# Patient Record
Sex: Male | Born: 1935
Health system: Southern US, Community
[De-identification: ages and names within clinical notes are randomized; demographics above are authoritative.]

## PROBLEM LIST (undated history)

## (undated) DIAGNOSIS — J9601 Acute respiratory failure with hypoxia: Secondary | ICD-10-CM

## (undated) DIAGNOSIS — R432 Parageusia: Secondary | ICD-10-CM

## (undated) DIAGNOSIS — R4181 Age-related cognitive decline: Secondary | ICD-10-CM

## (undated) DIAGNOSIS — D320 Benign neoplasm of cerebral meninges: Secondary | ICD-10-CM

## (undated) DIAGNOSIS — R911 Solitary pulmonary nodule: Secondary | ICD-10-CM

## (undated) DIAGNOSIS — R030 Elevated blood-pressure reading, without diagnosis of hypertension: Secondary | ICD-10-CM

## (undated) DIAGNOSIS — I2694 Multiple subsegmental pulmonary emboli without acute cor pulmonale: Secondary | ICD-10-CM

## (undated) HISTORY — DX: Solitary pulmonary nodule: R91.1

## (undated) HISTORY — DX: Age-related cognitive decline: R41.81

## (undated) HISTORY — DX: Acute respiratory failure with hypoxia: J96.01

## (undated) HISTORY — DX: Benign neoplasm of cerebral meninges: D32.0

## (undated) HISTORY — DX: Elevated blood-pressure reading, without diagnosis of hypertension: R03.0

## (undated) HISTORY — DX: Parageusia: R43.2

## (undated) HISTORY — PX: REPLACEMENT TOTAL KNEE BILATERAL: SUR1225

## (undated) HISTORY — PX: CATARACT EXTRACTION, BILATERAL: SHX1313

## (undated) HISTORY — DX: Multiple subsegmental thrombotic pulmonary emboli without acute cor pulmonale: I26.94

---

## 2015-01-04 DIAGNOSIS — M19011 Primary osteoarthritis, right shoulder: Secondary | ICD-10-CM | POA: Diagnosis not present

## 2015-01-04 DIAGNOSIS — R413 Other amnesia: Secondary | ICD-10-CM | POA: Diagnosis not present

## 2015-01-04 DIAGNOSIS — M25511 Pain in right shoulder: Secondary | ICD-10-CM | POA: Diagnosis not present

## 2015-01-10 DIAGNOSIS — R222 Localized swelling, mass and lump, trunk: Secondary | ICD-10-CM | POA: Diagnosis not present

## 2015-01-10 DIAGNOSIS — R918 Other nonspecific abnormal finding of lung field: Secondary | ICD-10-CM | POA: Diagnosis not present

## 2015-01-25 DIAGNOSIS — I10 Essential (primary) hypertension: Secondary | ICD-10-CM | POA: Diagnosis not present

## 2015-01-25 DIAGNOSIS — Z0000 Encounter for general adult medical examination without abnormal findings: Secondary | ICD-10-CM | POA: Diagnosis not present

## 2015-01-25 DIAGNOSIS — Z1211 Encounter for screening for malignant neoplasm of colon: Secondary | ICD-10-CM | POA: Diagnosis not present

## 2015-02-01 DIAGNOSIS — Z1211 Encounter for screening for malignant neoplasm of colon: Secondary | ICD-10-CM | POA: Diagnosis not present

## 2015-03-02 DIAGNOSIS — R911 Solitary pulmonary nodule: Secondary | ICD-10-CM | POA: Diagnosis not present

## 2015-03-02 DIAGNOSIS — M25511 Pain in right shoulder: Secondary | ICD-10-CM | POA: Diagnosis not present

## 2015-03-08 DIAGNOSIS — R918 Other nonspecific abnormal finding of lung field: Secondary | ICD-10-CM | POA: Diagnosis not present

## 2015-03-08 DIAGNOSIS — R911 Solitary pulmonary nodule: Secondary | ICD-10-CM | POA: Diagnosis not present

## 2015-03-09 DIAGNOSIS — M75101 Unspecified rotator cuff tear or rupture of right shoulder, not specified as traumatic: Secondary | ICD-10-CM | POA: Diagnosis not present

## 2015-03-09 DIAGNOSIS — M13811 Other specified arthritis, right shoulder: Secondary | ICD-10-CM | POA: Diagnosis not present

## 2015-03-09 DIAGNOSIS — M25511 Pain in right shoulder: Secondary | ICD-10-CM | POA: Diagnosis not present

## 2015-03-09 DIAGNOSIS — M65811 Other synovitis and tenosynovitis, right shoulder: Secondary | ICD-10-CM | POA: Diagnosis not present

## 2015-03-09 DIAGNOSIS — S4381XA Sprain of other specified parts of right shoulder girdle, initial encounter: Secondary | ICD-10-CM | POA: Diagnosis not present

## 2015-03-16 DIAGNOSIS — M75101 Unspecified rotator cuff tear or rupture of right shoulder, not specified as traumatic: Secondary | ICD-10-CM | POA: Diagnosis not present

## 2015-03-25 DIAGNOSIS — R9431 Abnormal electrocardiogram [ECG] [EKG]: Secondary | ICD-10-CM | POA: Diagnosis not present

## 2015-03-25 DIAGNOSIS — Z01810 Encounter for preprocedural cardiovascular examination: Secondary | ICD-10-CM | POA: Diagnosis not present

## 2015-03-25 DIAGNOSIS — Z01818 Encounter for other preprocedural examination: Secondary | ICD-10-CM | POA: Diagnosis not present

## 2015-03-25 DIAGNOSIS — I517 Cardiomegaly: Secondary | ICD-10-CM | POA: Diagnosis not present

## 2015-03-29 DIAGNOSIS — M7521 Bicipital tendinitis, right shoulder: Secondary | ICD-10-CM | POA: Diagnosis not present

## 2015-03-29 DIAGNOSIS — M75101 Unspecified rotator cuff tear or rupture of right shoulder, not specified as traumatic: Secondary | ICD-10-CM | POA: Diagnosis not present

## 2015-03-29 DIAGNOSIS — G8918 Other acute postprocedural pain: Secondary | ICD-10-CM | POA: Diagnosis not present

## 2015-03-29 DIAGNOSIS — M65811 Other synovitis and tenosynovitis, right shoulder: Secondary | ICD-10-CM | POA: Diagnosis not present

## 2015-03-29 DIAGNOSIS — M75121 Complete rotator cuff tear or rupture of right shoulder, not specified as traumatic: Secondary | ICD-10-CM | POA: Diagnosis not present

## 2015-03-29 DIAGNOSIS — R0683 Snoring: Secondary | ICD-10-CM | POA: Diagnosis not present

## 2015-03-29 DIAGNOSIS — Z87891 Personal history of nicotine dependence: Secondary | ICD-10-CM | POA: Diagnosis not present

## 2015-03-29 DIAGNOSIS — M7551 Bursitis of right shoulder: Secondary | ICD-10-CM | POA: Diagnosis not present

## 2015-04-01 DIAGNOSIS — M25511 Pain in right shoulder: Secondary | ICD-10-CM | POA: Diagnosis not present

## 2015-04-01 DIAGNOSIS — M75101 Unspecified rotator cuff tear or rupture of right shoulder, not specified as traumatic: Secondary | ICD-10-CM | POA: Diagnosis not present

## 2015-04-01 DIAGNOSIS — M25611 Stiffness of right shoulder, not elsewhere classified: Secondary | ICD-10-CM | POA: Diagnosis not present

## 2015-04-04 DIAGNOSIS — M25511 Pain in right shoulder: Secondary | ICD-10-CM | POA: Diagnosis not present

## 2015-04-04 DIAGNOSIS — M25611 Stiffness of right shoulder, not elsewhere classified: Secondary | ICD-10-CM | POA: Diagnosis not present

## 2015-04-04 DIAGNOSIS — M75101 Unspecified rotator cuff tear or rupture of right shoulder, not specified as traumatic: Secondary | ICD-10-CM | POA: Diagnosis not present

## 2015-04-06 DIAGNOSIS — M75101 Unspecified rotator cuff tear or rupture of right shoulder, not specified as traumatic: Secondary | ICD-10-CM | POA: Diagnosis not present

## 2015-04-06 DIAGNOSIS — M25611 Stiffness of right shoulder, not elsewhere classified: Secondary | ICD-10-CM | POA: Diagnosis not present

## 2015-04-06 DIAGNOSIS — M25511 Pain in right shoulder: Secondary | ICD-10-CM | POA: Diagnosis not present

## 2015-04-08 DIAGNOSIS — M25511 Pain in right shoulder: Secondary | ICD-10-CM | POA: Diagnosis not present

## 2015-04-08 DIAGNOSIS — M75101 Unspecified rotator cuff tear or rupture of right shoulder, not specified as traumatic: Secondary | ICD-10-CM | POA: Diagnosis not present

## 2015-04-08 DIAGNOSIS — M25611 Stiffness of right shoulder, not elsewhere classified: Secondary | ICD-10-CM | POA: Diagnosis not present

## 2015-04-13 DIAGNOSIS — M75101 Unspecified rotator cuff tear or rupture of right shoulder, not specified as traumatic: Secondary | ICD-10-CM | POA: Diagnosis not present

## 2015-04-13 DIAGNOSIS — M25611 Stiffness of right shoulder, not elsewhere classified: Secondary | ICD-10-CM | POA: Diagnosis not present

## 2015-04-13 DIAGNOSIS — M25511 Pain in right shoulder: Secondary | ICD-10-CM | POA: Diagnosis not present

## 2015-04-14 DIAGNOSIS — M75101 Unspecified rotator cuff tear or rupture of right shoulder, not specified as traumatic: Secondary | ICD-10-CM | POA: Diagnosis not present

## 2015-04-14 DIAGNOSIS — M25611 Stiffness of right shoulder, not elsewhere classified: Secondary | ICD-10-CM | POA: Diagnosis not present

## 2015-04-14 DIAGNOSIS — M25511 Pain in right shoulder: Secondary | ICD-10-CM | POA: Diagnosis not present

## 2015-04-18 DIAGNOSIS — M75101 Unspecified rotator cuff tear or rupture of right shoulder, not specified as traumatic: Secondary | ICD-10-CM | POA: Diagnosis not present

## 2015-04-18 DIAGNOSIS — M25511 Pain in right shoulder: Secondary | ICD-10-CM | POA: Diagnosis not present

## 2015-04-18 DIAGNOSIS — M25611 Stiffness of right shoulder, not elsewhere classified: Secondary | ICD-10-CM | POA: Diagnosis not present

## 2015-04-20 DIAGNOSIS — M25611 Stiffness of right shoulder, not elsewhere classified: Secondary | ICD-10-CM | POA: Diagnosis not present

## 2015-04-20 DIAGNOSIS — M75101 Unspecified rotator cuff tear or rupture of right shoulder, not specified as traumatic: Secondary | ICD-10-CM | POA: Diagnosis not present

## 2015-04-20 DIAGNOSIS — M25511 Pain in right shoulder: Secondary | ICD-10-CM | POA: Diagnosis not present

## 2015-04-22 DIAGNOSIS — M75101 Unspecified rotator cuff tear or rupture of right shoulder, not specified as traumatic: Secondary | ICD-10-CM | POA: Diagnosis not present

## 2015-04-22 DIAGNOSIS — M25611 Stiffness of right shoulder, not elsewhere classified: Secondary | ICD-10-CM | POA: Diagnosis not present

## 2015-04-22 DIAGNOSIS — M25511 Pain in right shoulder: Secondary | ICD-10-CM | POA: Diagnosis not present

## 2015-04-25 DIAGNOSIS — M25511 Pain in right shoulder: Secondary | ICD-10-CM | POA: Diagnosis not present

## 2015-04-25 DIAGNOSIS — M25611 Stiffness of right shoulder, not elsewhere classified: Secondary | ICD-10-CM | POA: Diagnosis not present

## 2015-04-25 DIAGNOSIS — M75101 Unspecified rotator cuff tear or rupture of right shoulder, not specified as traumatic: Secondary | ICD-10-CM | POA: Diagnosis not present

## 2015-04-27 DIAGNOSIS — M25511 Pain in right shoulder: Secondary | ICD-10-CM | POA: Diagnosis not present

## 2015-04-27 DIAGNOSIS — M75101 Unspecified rotator cuff tear or rupture of right shoulder, not specified as traumatic: Secondary | ICD-10-CM | POA: Diagnosis not present

## 2015-04-27 DIAGNOSIS — M25611 Stiffness of right shoulder, not elsewhere classified: Secondary | ICD-10-CM | POA: Diagnosis not present

## 2015-04-29 DIAGNOSIS — M25511 Pain in right shoulder: Secondary | ICD-10-CM | POA: Diagnosis not present

## 2015-04-29 DIAGNOSIS — M75101 Unspecified rotator cuff tear or rupture of right shoulder, not specified as traumatic: Secondary | ICD-10-CM | POA: Diagnosis not present

## 2015-04-29 DIAGNOSIS — M25611 Stiffness of right shoulder, not elsewhere classified: Secondary | ICD-10-CM | POA: Diagnosis not present

## 2015-05-04 DIAGNOSIS — M25611 Stiffness of right shoulder, not elsewhere classified: Secondary | ICD-10-CM | POA: Diagnosis not present

## 2015-05-04 DIAGNOSIS — M75101 Unspecified rotator cuff tear or rupture of right shoulder, not specified as traumatic: Secondary | ICD-10-CM | POA: Diagnosis not present

## 2015-05-04 DIAGNOSIS — M25511 Pain in right shoulder: Secondary | ICD-10-CM | POA: Diagnosis not present

## 2015-05-06 DIAGNOSIS — M25511 Pain in right shoulder: Secondary | ICD-10-CM | POA: Diagnosis not present

## 2015-05-06 DIAGNOSIS — M25611 Stiffness of right shoulder, not elsewhere classified: Secondary | ICD-10-CM | POA: Diagnosis not present

## 2015-05-06 DIAGNOSIS — M75101 Unspecified rotator cuff tear or rupture of right shoulder, not specified as traumatic: Secondary | ICD-10-CM | POA: Diagnosis not present

## 2015-05-11 DIAGNOSIS — M75101 Unspecified rotator cuff tear or rupture of right shoulder, not specified as traumatic: Secondary | ICD-10-CM | POA: Diagnosis not present

## 2015-05-11 DIAGNOSIS — M25611 Stiffness of right shoulder, not elsewhere classified: Secondary | ICD-10-CM | POA: Diagnosis not present

## 2015-05-11 DIAGNOSIS — M25511 Pain in right shoulder: Secondary | ICD-10-CM | POA: Diagnosis not present

## 2015-05-13 DIAGNOSIS — M25611 Stiffness of right shoulder, not elsewhere classified: Secondary | ICD-10-CM | POA: Diagnosis not present

## 2015-05-13 DIAGNOSIS — M25511 Pain in right shoulder: Secondary | ICD-10-CM | POA: Diagnosis not present

## 2015-05-13 DIAGNOSIS — M75101 Unspecified rotator cuff tear or rupture of right shoulder, not specified as traumatic: Secondary | ICD-10-CM | POA: Diagnosis not present

## 2015-05-16 DIAGNOSIS — M75101 Unspecified rotator cuff tear or rupture of right shoulder, not specified as traumatic: Secondary | ICD-10-CM | POA: Diagnosis not present

## 2015-05-16 DIAGNOSIS — M25511 Pain in right shoulder: Secondary | ICD-10-CM | POA: Diagnosis not present

## 2015-05-16 DIAGNOSIS — M25611 Stiffness of right shoulder, not elsewhere classified: Secondary | ICD-10-CM | POA: Diagnosis not present

## 2015-05-18 DIAGNOSIS — M75101 Unspecified rotator cuff tear or rupture of right shoulder, not specified as traumatic: Secondary | ICD-10-CM | POA: Diagnosis not present

## 2015-05-18 DIAGNOSIS — M25611 Stiffness of right shoulder, not elsewhere classified: Secondary | ICD-10-CM | POA: Diagnosis not present

## 2015-05-18 DIAGNOSIS — M25511 Pain in right shoulder: Secondary | ICD-10-CM | POA: Diagnosis not present

## 2015-05-20 DIAGNOSIS — M25511 Pain in right shoulder: Secondary | ICD-10-CM | POA: Diagnosis not present

## 2015-05-20 DIAGNOSIS — M25611 Stiffness of right shoulder, not elsewhere classified: Secondary | ICD-10-CM | POA: Diagnosis not present

## 2015-05-20 DIAGNOSIS — M75101 Unspecified rotator cuff tear or rupture of right shoulder, not specified as traumatic: Secondary | ICD-10-CM | POA: Diagnosis not present

## 2015-05-23 DIAGNOSIS — M75101 Unspecified rotator cuff tear or rupture of right shoulder, not specified as traumatic: Secondary | ICD-10-CM | POA: Diagnosis not present

## 2015-05-23 DIAGNOSIS — M25511 Pain in right shoulder: Secondary | ICD-10-CM | POA: Diagnosis not present

## 2015-05-23 DIAGNOSIS — M25611 Stiffness of right shoulder, not elsewhere classified: Secondary | ICD-10-CM | POA: Diagnosis not present

## 2015-05-25 DIAGNOSIS — M25611 Stiffness of right shoulder, not elsewhere classified: Secondary | ICD-10-CM | POA: Diagnosis not present

## 2015-05-25 DIAGNOSIS — M25511 Pain in right shoulder: Secondary | ICD-10-CM | POA: Diagnosis not present

## 2015-05-25 DIAGNOSIS — M75101 Unspecified rotator cuff tear or rupture of right shoulder, not specified as traumatic: Secondary | ICD-10-CM | POA: Diagnosis not present

## 2015-05-30 DIAGNOSIS — M25511 Pain in right shoulder: Secondary | ICD-10-CM | POA: Diagnosis not present

## 2015-05-30 DIAGNOSIS — M25611 Stiffness of right shoulder, not elsewhere classified: Secondary | ICD-10-CM | POA: Diagnosis not present

## 2015-05-30 DIAGNOSIS — M75101 Unspecified rotator cuff tear or rupture of right shoulder, not specified as traumatic: Secondary | ICD-10-CM | POA: Diagnosis not present

## 2015-06-01 DIAGNOSIS — M75101 Unspecified rotator cuff tear or rupture of right shoulder, not specified as traumatic: Secondary | ICD-10-CM | POA: Diagnosis not present

## 2015-06-01 DIAGNOSIS — M25611 Stiffness of right shoulder, not elsewhere classified: Secondary | ICD-10-CM | POA: Diagnosis not present

## 2015-06-01 DIAGNOSIS — M25511 Pain in right shoulder: Secondary | ICD-10-CM | POA: Diagnosis not present

## 2015-06-06 DIAGNOSIS — M25611 Stiffness of right shoulder, not elsewhere classified: Secondary | ICD-10-CM | POA: Diagnosis not present

## 2015-06-06 DIAGNOSIS — M25511 Pain in right shoulder: Secondary | ICD-10-CM | POA: Diagnosis not present

## 2015-06-06 DIAGNOSIS — M75101 Unspecified rotator cuff tear or rupture of right shoulder, not specified as traumatic: Secondary | ICD-10-CM | POA: Diagnosis not present

## 2015-06-08 DIAGNOSIS — M25511 Pain in right shoulder: Secondary | ICD-10-CM | POA: Diagnosis not present

## 2015-06-08 DIAGNOSIS — M25611 Stiffness of right shoulder, not elsewhere classified: Secondary | ICD-10-CM | POA: Diagnosis not present

## 2015-06-08 DIAGNOSIS — M75101 Unspecified rotator cuff tear or rupture of right shoulder, not specified as traumatic: Secondary | ICD-10-CM | POA: Diagnosis not present

## 2015-06-13 DIAGNOSIS — M75101 Unspecified rotator cuff tear or rupture of right shoulder, not specified as traumatic: Secondary | ICD-10-CM | POA: Diagnosis not present

## 2015-06-13 DIAGNOSIS — M25511 Pain in right shoulder: Secondary | ICD-10-CM | POA: Diagnosis not present

## 2015-06-13 DIAGNOSIS — M25611 Stiffness of right shoulder, not elsewhere classified: Secondary | ICD-10-CM | POA: Diagnosis not present

## 2015-06-15 DIAGNOSIS — M25511 Pain in right shoulder: Secondary | ICD-10-CM | POA: Diagnosis not present

## 2015-06-15 DIAGNOSIS — M25611 Stiffness of right shoulder, not elsewhere classified: Secondary | ICD-10-CM | POA: Diagnosis not present

## 2015-06-15 DIAGNOSIS — M75101 Unspecified rotator cuff tear or rupture of right shoulder, not specified as traumatic: Secondary | ICD-10-CM | POA: Diagnosis not present

## 2015-06-20 DIAGNOSIS — M75101 Unspecified rotator cuff tear or rupture of right shoulder, not specified as traumatic: Secondary | ICD-10-CM | POA: Diagnosis not present

## 2015-06-20 DIAGNOSIS — M25511 Pain in right shoulder: Secondary | ICD-10-CM | POA: Diagnosis not present

## 2015-06-20 DIAGNOSIS — M25611 Stiffness of right shoulder, not elsewhere classified: Secondary | ICD-10-CM | POA: Diagnosis not present

## 2015-06-22 DIAGNOSIS — M25611 Stiffness of right shoulder, not elsewhere classified: Secondary | ICD-10-CM | POA: Diagnosis not present

## 2015-06-22 DIAGNOSIS — M25511 Pain in right shoulder: Secondary | ICD-10-CM | POA: Diagnosis not present

## 2015-06-22 DIAGNOSIS — M75101 Unspecified rotator cuff tear or rupture of right shoulder, not specified as traumatic: Secondary | ICD-10-CM | POA: Diagnosis not present

## 2015-09-06 DIAGNOSIS — Z23 Encounter for immunization: Secondary | ICD-10-CM | POA: Diagnosis not present

## 2016-07-12 DIAGNOSIS — H52223 Regular astigmatism, bilateral: Secondary | ICD-10-CM | POA: Diagnosis not present

## 2016-07-12 DIAGNOSIS — H5210 Myopia, unspecified eye: Secondary | ICD-10-CM | POA: Diagnosis not present

## 2017-02-19 DIAGNOSIS — H18412 Arcus senilis, left eye: Secondary | ICD-10-CM | POA: Diagnosis not present

## 2017-02-19 DIAGNOSIS — H18411 Arcus senilis, right eye: Secondary | ICD-10-CM | POA: Diagnosis not present

## 2017-02-19 DIAGNOSIS — H02839 Dermatochalasis of unspecified eye, unspecified eyelid: Secondary | ICD-10-CM | POA: Diagnosis not present

## 2017-02-19 DIAGNOSIS — H2511 Age-related nuclear cataract, right eye: Secondary | ICD-10-CM | POA: Diagnosis not present

## 2017-02-19 DIAGNOSIS — H2513 Age-related nuclear cataract, bilateral: Secondary | ICD-10-CM | POA: Diagnosis not present

## 2017-03-11 DIAGNOSIS — H2511 Age-related nuclear cataract, right eye: Secondary | ICD-10-CM | POA: Diagnosis not present

## 2017-03-11 DIAGNOSIS — H2513 Age-related nuclear cataract, bilateral: Secondary | ICD-10-CM | POA: Diagnosis not present

## 2017-03-12 DIAGNOSIS — H2512 Age-related nuclear cataract, left eye: Secondary | ICD-10-CM | POA: Diagnosis not present

## 2017-04-01 DIAGNOSIS — H2512 Age-related nuclear cataract, left eye: Secondary | ICD-10-CM | POA: Diagnosis not present

## 2017-05-06 DIAGNOSIS — H35353 Cystoid macular degeneration, bilateral: Secondary | ICD-10-CM | POA: Diagnosis not present

## 2017-05-16 DIAGNOSIS — H59033 Cystoid macular edema following cataract surgery, bilateral: Secondary | ICD-10-CM | POA: Diagnosis not present

## 2017-05-16 DIAGNOSIS — H43813 Vitreous degeneration, bilateral: Secondary | ICD-10-CM | POA: Diagnosis not present

## 2017-05-16 DIAGNOSIS — H35033 Hypertensive retinopathy, bilateral: Secondary | ICD-10-CM | POA: Diagnosis not present

## 2017-05-16 DIAGNOSIS — H35373 Puckering of macula, bilateral: Secondary | ICD-10-CM | POA: Diagnosis not present

## 2017-06-13 DIAGNOSIS — H59033 Cystoid macular edema following cataract surgery, bilateral: Secondary | ICD-10-CM | POA: Diagnosis not present

## 2017-06-13 DIAGNOSIS — H43822 Vitreomacular adhesion, left eye: Secondary | ICD-10-CM | POA: Diagnosis not present

## 2017-06-13 DIAGNOSIS — H35373 Puckering of macula, bilateral: Secondary | ICD-10-CM | POA: Diagnosis not present

## 2017-06-13 DIAGNOSIS — H35033 Hypertensive retinopathy, bilateral: Secondary | ICD-10-CM | POA: Diagnosis not present

## 2017-08-01 DIAGNOSIS — H43822 Vitreomacular adhesion, left eye: Secondary | ICD-10-CM | POA: Diagnosis not present

## 2017-08-01 DIAGNOSIS — H35373 Puckering of macula, bilateral: Secondary | ICD-10-CM | POA: Diagnosis not present

## 2017-08-01 DIAGNOSIS — H35033 Hypertensive retinopathy, bilateral: Secondary | ICD-10-CM | POA: Diagnosis not present

## 2017-08-01 DIAGNOSIS — H43813 Vitreous degeneration, bilateral: Secondary | ICD-10-CM | POA: Diagnosis not present

## 2017-08-29 DIAGNOSIS — H43822 Vitreomacular adhesion, left eye: Secondary | ICD-10-CM | POA: Diagnosis not present

## 2017-08-29 DIAGNOSIS — H35033 Hypertensive retinopathy, bilateral: Secondary | ICD-10-CM | POA: Diagnosis not present

## 2017-08-29 DIAGNOSIS — H59033 Cystoid macular edema following cataract surgery, bilateral: Secondary | ICD-10-CM | POA: Diagnosis not present

## 2017-08-29 DIAGNOSIS — H35373 Puckering of macula, bilateral: Secondary | ICD-10-CM | POA: Diagnosis not present

## 2017-11-07 DIAGNOSIS — H35373 Puckering of macula, bilateral: Secondary | ICD-10-CM | POA: Diagnosis not present

## 2017-11-07 DIAGNOSIS — H35423 Microcystoid degeneration of retina, bilateral: Secondary | ICD-10-CM | POA: Diagnosis not present

## 2017-11-07 DIAGNOSIS — H35033 Hypertensive retinopathy, bilateral: Secondary | ICD-10-CM | POA: Diagnosis not present

## 2017-11-07 DIAGNOSIS — H43822 Vitreomacular adhesion, left eye: Secondary | ICD-10-CM | POA: Diagnosis not present

## 2017-11-13 DIAGNOSIS — Z0001 Encounter for general adult medical examination with abnormal findings: Secondary | ICD-10-CM | POA: Diagnosis not present

## 2017-11-13 DIAGNOSIS — Z6829 Body mass index (BMI) 29.0-29.9, adult: Secondary | ICD-10-CM | POA: Diagnosis not present

## 2017-11-13 DIAGNOSIS — R5383 Other fatigue: Secondary | ICD-10-CM | POA: Diagnosis not present

## 2017-11-13 DIAGNOSIS — R413 Other amnesia: Secondary | ICD-10-CM | POA: Diagnosis not present

## 2017-11-13 DIAGNOSIS — Z23 Encounter for immunization: Secondary | ICD-10-CM | POA: Diagnosis not present

## 2017-11-26 DIAGNOSIS — Z1211 Encounter for screening for malignant neoplasm of colon: Secondary | ICD-10-CM | POA: Diagnosis not present

## 2017-11-26 DIAGNOSIS — Z1212 Encounter for screening for malignant neoplasm of rectum: Secondary | ICD-10-CM | POA: Diagnosis not present

## 2018-01-09 DIAGNOSIS — H35033 Hypertensive retinopathy, bilateral: Secondary | ICD-10-CM | POA: Diagnosis not present

## 2018-01-09 DIAGNOSIS — H35373 Puckering of macula, bilateral: Secondary | ICD-10-CM | POA: Diagnosis not present

## 2018-01-09 DIAGNOSIS — H43822 Vitreomacular adhesion, left eye: Secondary | ICD-10-CM | POA: Diagnosis not present

## 2018-01-09 DIAGNOSIS — H35423 Microcystoid degeneration of retina, bilateral: Secondary | ICD-10-CM | POA: Diagnosis not present

## 2018-04-10 DIAGNOSIS — H35033 Hypertensive retinopathy, bilateral: Secondary | ICD-10-CM | POA: Diagnosis not present

## 2018-04-10 DIAGNOSIS — H43822 Vitreomacular adhesion, left eye: Secondary | ICD-10-CM | POA: Diagnosis not present

## 2018-04-10 DIAGNOSIS — H35423 Microcystoid degeneration of retina, bilateral: Secondary | ICD-10-CM | POA: Diagnosis not present

## 2018-04-10 DIAGNOSIS — H35373 Puckering of macula, bilateral: Secondary | ICD-10-CM | POA: Diagnosis not present

## 2018-04-17 DIAGNOSIS — H5203 Hypermetropia, bilateral: Secondary | ICD-10-CM | POA: Diagnosis not present

## 2018-04-17 DIAGNOSIS — H35353 Cystoid macular degeneration, bilateral: Secondary | ICD-10-CM | POA: Diagnosis not present

## 2018-07-01 DIAGNOSIS — Z23 Encounter for immunization: Secondary | ICD-10-CM | POA: Diagnosis not present

## 2018-08-07 DIAGNOSIS — H35373 Puckering of macula, bilateral: Secondary | ICD-10-CM | POA: Diagnosis not present

## 2018-08-07 DIAGNOSIS — H43822 Vitreomacular adhesion, left eye: Secondary | ICD-10-CM | POA: Diagnosis not present

## 2018-09-10 DIAGNOSIS — Z0100 Encounter for examination of eyes and vision without abnormal findings: Secondary | ICD-10-CM | POA: Diagnosis not present

## 2018-11-20 DIAGNOSIS — R4181 Age-related cognitive decline: Secondary | ICD-10-CM | POA: Diagnosis not present

## 2018-11-20 DIAGNOSIS — Z23 Encounter for immunization: Secondary | ICD-10-CM | POA: Diagnosis not present

## 2018-11-20 DIAGNOSIS — Z0001 Encounter for general adult medical examination with abnormal findings: Secondary | ICD-10-CM | POA: Diagnosis not present

## 2018-11-20 DIAGNOSIS — E663 Overweight: Secondary | ICD-10-CM | POA: Diagnosis not present

## 2018-11-20 DIAGNOSIS — R030 Elevated blood-pressure reading, without diagnosis of hypertension: Secondary | ICD-10-CM | POA: Diagnosis not present

## 2018-11-20 DIAGNOSIS — Z0000 Encounter for general adult medical examination without abnormal findings: Secondary | ICD-10-CM | POA: Diagnosis not present

## 2018-11-20 DIAGNOSIS — Z6829 Body mass index (BMI) 29.0-29.9, adult: Secondary | ICD-10-CM | POA: Diagnosis not present

## 2019-06-24 DIAGNOSIS — Z23 Encounter for immunization: Secondary | ICD-10-CM | POA: Diagnosis not present

## 2019-06-24 DIAGNOSIS — R413 Other amnesia: Secondary | ICD-10-CM | POA: Diagnosis not present

## 2019-07-13 ENCOUNTER — Other Ambulatory Visit: Payer: Self-pay | Admitting: Family Medicine

## 2019-07-13 DIAGNOSIS — R413 Other amnesia: Secondary | ICD-10-CM

## 2019-07-14 ENCOUNTER — Other Ambulatory Visit: Payer: Self-pay

## 2019-07-14 ENCOUNTER — Ambulatory Visit
Admission: RE | Admit: 2019-07-14 | Discharge: 2019-07-14 | Disposition: A | Discharge status: Home / Self Care | Payer: Medicare HMO | Source: Ambulatory Visit | Attending: Family Medicine | Admitting: Family Medicine

## 2019-07-14 DIAGNOSIS — R413 Other amnesia: Secondary | ICD-10-CM | POA: Diagnosis not present

## 2019-07-15 DIAGNOSIS — D320 Benign neoplasm of cerebral meninges: Secondary | ICD-10-CM | POA: Diagnosis not present

## 2019-07-21 DIAGNOSIS — G9389 Other specified disorders of brain: Secondary | ICD-10-CM | POA: Diagnosis not present

## 2019-07-23 DIAGNOSIS — G9389 Other specified disorders of brain: Secondary | ICD-10-CM | POA: Diagnosis not present

## 2019-08-06 DIAGNOSIS — D332 Benign neoplasm of brain, unspecified: Secondary | ICD-10-CM | POA: Diagnosis not present

## 2019-08-06 DIAGNOSIS — G9389 Other specified disorders of brain: Secondary | ICD-10-CM | POA: Diagnosis not present

## 2019-08-18 DIAGNOSIS — D329 Benign neoplasm of meninges, unspecified: Secondary | ICD-10-CM | POA: Insufficient documentation

## 2019-08-26 DIAGNOSIS — R432 Parageusia: Secondary | ICD-10-CM | POA: Diagnosis not present

## 2019-08-26 DIAGNOSIS — J018 Other acute sinusitis: Secondary | ICD-10-CM | POA: Diagnosis not present

## 2019-08-26 DIAGNOSIS — R5383 Other fatigue: Secondary | ICD-10-CM | POA: Diagnosis not present

## 2019-08-28 DIAGNOSIS — J189 Pneumonia, unspecified organism: Secondary | ICD-10-CM | POA: Diagnosis not present

## 2019-08-28 DIAGNOSIS — R0902 Hypoxemia: Secondary | ICD-10-CM | POA: Diagnosis not present

## 2019-08-28 DIAGNOSIS — R600 Localized edema: Secondary | ICD-10-CM | POA: Diagnosis not present

## 2019-08-28 DIAGNOSIS — I2694 Multiple subsegmental pulmonary emboli without acute cor pulmonale: Secondary | ICD-10-CM | POA: Diagnosis not present

## 2019-08-28 DIAGNOSIS — Z792 Long term (current) use of antibiotics: Secondary | ICD-10-CM | POA: Diagnosis not present

## 2019-08-28 DIAGNOSIS — Z209 Contact with and (suspected) exposure to unspecified communicable disease: Secondary | ICD-10-CM | POA: Diagnosis not present

## 2019-08-28 DIAGNOSIS — J9601 Acute respiratory failure with hypoxia: Secondary | ICD-10-CM | POA: Diagnosis not present

## 2019-08-28 DIAGNOSIS — M1990 Unspecified osteoarthritis, unspecified site: Secondary | ICD-10-CM | POA: Diagnosis not present

## 2019-08-28 DIAGNOSIS — I2699 Other pulmonary embolism without acute cor pulmonale: Secondary | ICD-10-CM | POA: Diagnosis not present

## 2019-08-28 DIAGNOSIS — Z87891 Personal history of nicotine dependence: Secondary | ICD-10-CM | POA: Diagnosis not present

## 2019-08-28 DIAGNOSIS — Z03818 Encounter for observation for suspected exposure to other biological agents ruled out: Secondary | ICD-10-CM | POA: Diagnosis not present

## 2019-08-28 DIAGNOSIS — I361 Nonrheumatic tricuspid (valve) insufficiency: Secondary | ICD-10-CM | POA: Diagnosis not present

## 2019-08-28 DIAGNOSIS — E876 Hypokalemia: Secondary | ICD-10-CM | POA: Diagnosis not present

## 2019-08-28 DIAGNOSIS — R05 Cough: Secondary | ICD-10-CM | POA: Diagnosis not present

## 2019-08-28 DIAGNOSIS — R7303 Prediabetes: Secondary | ICD-10-CM | POA: Diagnosis not present

## 2019-08-28 DIAGNOSIS — R0602 Shortness of breath: Secondary | ICD-10-CM | POA: Diagnosis not present

## 2019-08-30 DIAGNOSIS — R0602 Shortness of breath: Secondary | ICD-10-CM | POA: Diagnosis not present

## 2019-09-09 DIAGNOSIS — J9601 Acute respiratory failure with hypoxia: Secondary | ICD-10-CM | POA: Diagnosis not present

## 2019-09-09 DIAGNOSIS — Z86711 Personal history of pulmonary embolism: Secondary | ICD-10-CM | POA: Diagnosis not present

## 2019-09-09 DIAGNOSIS — Z6827 Body mass index (BMI) 27.0-27.9, adult: Secondary | ICD-10-CM | POA: Diagnosis not present

## 2019-09-09 DIAGNOSIS — I2694 Multiple subsegmental pulmonary emboli without acute cor pulmonale: Secondary | ICD-10-CM | POA: Diagnosis not present

## 2019-12-03 ENCOUNTER — Encounter: Payer: Self-pay | Admitting: Family Medicine

## 2019-12-03 ENCOUNTER — Ambulatory Visit (INDEPENDENT_AMBULATORY_CARE_PROVIDER_SITE_OTHER): Payer: Medicare HMO | Admitting: Family Medicine

## 2019-12-03 ENCOUNTER — Other Ambulatory Visit: Payer: Self-pay

## 2019-12-03 VITALS — BP 120/80 | HR 84 | Temp 97.1°F | Ht 69.0 in | Wt 197.0 lb

## 2019-12-03 DIAGNOSIS — E782 Mixed hyperlipidemia: Secondary | ICD-10-CM

## 2019-12-03 DIAGNOSIS — I2699 Other pulmonary embolism without acute cor pulmonale: Secondary | ICD-10-CM | POA: Diagnosis not present

## 2019-12-03 DIAGNOSIS — R413 Other amnesia: Secondary | ICD-10-CM | POA: Diagnosis not present

## 2019-12-04 LAB — CARDIOVASCULAR RISK ASSESSMENT

## 2019-12-04 LAB — LIPID PANEL W/O CHOL/HDL RATIO
Cholesterol, Total: 231 mg/dL — ABNORMAL HIGH (ref 100–199)
HDL: 55 mg/dL (ref >39)
LDL Chol Calc (NIH): 158 mg/dL — ABNORMAL HIGH (ref 0–99)
Triglycerides: 99 mg/dL (ref 0–149)
VLDL Cholesterol Cal: 18 mg/dL (ref 5–40)

## 2019-12-04 LAB — COMPREHENSIVE METABOLIC PANEL
ALT: 16 IU/L (ref 0–44)
AST: 22 IU/L (ref 0–40)
Albumin/Globulin Ratio: 1.7 (ref 1.2–2.2)
Albumin: 4.3 g/dL (ref 3.6–4.6)
Alkaline Phosphatase: 89 IU/L (ref 39–117)
BUN/Creatinine Ratio: 12 (ref 10–24)
BUN: 12 mg/dL (ref 8–27)
Bilirubin Total: 0.4 mg/dL (ref 0.0–1.2)
CO2: 26 mmol/L (ref 20–29)
Calcium: 9.4 mg/dL (ref 8.6–10.2)
Chloride: 105 mmol/L (ref 96–106)
Creatinine, Ser: 1 mg/dL (ref 0.76–1.27)
GFR calc Af Amer: 78 mL/min/{1.73_m2} (ref >59)
GFR calc non Af Amer: 68 mL/min/{1.73_m2} (ref >59)
Globulin, Total: 2.5 g/dL (ref 1.5–4.5)
Glucose: 100 mg/dL — ABNORMAL HIGH (ref 65–99)
Potassium: 4.8 mmol/L (ref 3.5–5.2)
Sodium: 143 mmol/L (ref 134–144)
Total Protein: 6.8 g/dL (ref 6.0–8.5)

## 2019-12-04 LAB — CBC WITH DIFFERENTIAL/PLATELET
Basophils Absolute: 0.1 10*3/uL (ref 0.0–0.2)
Basos: 1 %
EOS (ABSOLUTE): 0.3 10*3/uL (ref 0.0–0.4)
Eos: 4 %
Hematocrit: 43.4 % (ref 37.5–51.0)
Hemoglobin: 14.9 g/dL (ref 13.0–17.7)
Immature Grans (Abs): 0 10*3/uL (ref 0.0–0.1)
Immature Granulocytes: 0 %
Lymphocytes Absolute: 2.8 10*3/uL (ref 0.7–3.1)
Lymphs: 37 %
MCH: 29.9 pg (ref 26.6–33.0)
MCHC: 34.3 g/dL (ref 31.5–35.7)
MCV: 87 fL (ref 79–97)
Monocytes Absolute: 0.6 10*3/uL (ref 0.1–0.9)
Monocytes: 8 %
Neutrophils Absolute: 3.7 10*3/uL (ref 1.4–7.0)
Neutrophils: 50 %
Platelets: 267 10*3/uL (ref 150–450)
RBC: 4.98 x10E6/uL (ref 4.14–5.80)
RDW: 12.6 % (ref 11.6–15.4)
WBC: 7.6 10*3/uL (ref 3.4–10.8)

## 2019-12-06 NOTE — Progress Notes (Deleted)
Blood count normal.  Liver function normal Kidney function normal Thyroid stimulating hormone normal or therapeutic.  Lipid panel abnormal. LDL too high. Strongly recommended crestor 10 mg once daily at night.

## 2019-12-07 ENCOUNTER — Telehealth: Payer: Self-pay

## 2019-12-07 ENCOUNTER — Encounter: Payer: Self-pay | Admitting: Family Medicine

## 2019-12-07 DIAGNOSIS — R413 Other amnesia: Secondary | ICD-10-CM | POA: Insufficient documentation

## 2019-12-07 DIAGNOSIS — I2699 Other pulmonary embolism without acute cor pulmonale: Secondary | ICD-10-CM | POA: Insufficient documentation

## 2019-12-07 DIAGNOSIS — E782 Mixed hyperlipidemia: Secondary | ICD-10-CM | POA: Insufficient documentation

## 2019-12-07 NOTE — Telephone Encounter (Signed)
I gave the results to his wife Shirlee Limerick. She will tell him about Crestor and he will call us back if he decide to take it.

## 2019-12-07 NOTE — Progress Notes (Signed)
Established Patient Office Visit  Subjective:  Patient ID: Mark Watkins, male    DOB: 03-May-1931  Age: 86 y.o. MRN: [id number]  CC:  Chief Complaint  Patient presents with  . Hyperlipidemia  . Memory Loss    HPI Mark Watkins presents for Hyperlipidemia This is a chronic problem. The current episode started more than 1 year ago. Recent lipid tests were reviewed and are high. Pertinent negatives include no chest pain, myalgias or shortness of breath. He is currently on no antihyperlipidemic treatment. Compliance problems include adherence to diet and adherence to exercise.    Pulmonary emboli diagnosed in fall 2020. Patient is currently on eliquis. Coagulopathy work up was negative. Denies dyspnea or cough.  Past Medical History:  Diagnosis Date  . Acute respiratory failure with hypoxia ([place])   . Age-related cognitive decline   . Benign neoplasm of cerebral meninges ([place])   . Elevated BP without diagnosis of hypertension   . Multiple subsegmental pulmonary emboli without acute cor pulmonale ([place])   . Parageusia   . Solitary pulmonary nodule     Past Surgical History:  Procedure Laterality Date  . CATARACT EXTRACTION, BILATERAL    . REPLACEMENT TOTAL KNEE BILATERAL      Family History  Problem Relation Age of Onset  . Alzheimer's disease Mother   . Osteoarthritis Mother   . Heart murmur Mother   . Heart attack Father     Social History   Socioeconomic History  . Marital status: Married    Spouse name: Not on file  . Number of children: Not on file  . Years of education: Not on file  . Highest education level: Not on file  Occupational History  . Not on file  Tobacco Use  . Smoking status: Former Smoker    Quit date: 1967    Years since quitting: 54.1  . Smokeless tobacco: Never Used  Substance and Sexual Activity  . Alcohol use: Never  . Drug use: Never  . Sexual activity: Not on file  Other Topics Concern  . Not on file  Social History Narrative   . Not on file   Social Determinants of Health   Financial Resource Strain:   . Difficulty of Paying Living Expenses: Not on file  Food Insecurity:   . Worried About Charity fundraiser in the Last Year: Not on file  . Ran Out of Food in the Last Year: Not on file  Transportation Needs:   . Lack of Transportation (Medical): Not on file  . Lack of Transportation (Non-Medical): Not on file  Physical Activity:   . Days of Exercise per Week: Not on file  . Minutes of Exercise per Session: Not on file  Stress:   . Feeling of Stress : Not on file  Social Connections:   . Frequency of Communication with Friends and Family: Not on file  . Frequency of Social Gatherings with Friends and Family: Not on file  . Attends Religious Services: Not on file  . Active Member of Clubs or Organizations: Not on file  . Attends Archivist Meetings: Not on file  . Marital Status: Not on file  Intimate Partner Violence:   . Fear of Current or Ex-Partner: Not on file  . Emotionally Abused: Not on file  . Physically Abused: Not on file  . Sexually Abused: Not on file    Outpatient Medications Prior to Visit  Medication Sig Dispense Refill  . ELIQUIS 5 MG TABS  tablet      No facility-administered medications prior to visit.    No Known Allergies  ROS Review of Systems  Constitutional: Negative for chills, fatigue and fever.  HENT: Negative for congestion, ear pain and sore throat.   Respiratory: Negative for cough and shortness of breath.   Cardiovascular: Negative for chest pain.  Gastrointestinal: Negative for abdominal pain, constipation, diarrhea, nausea and vomiting.  Endocrine: Negative for polydipsia, polyphagia and polyuria.  Genitourinary: Negative for dysuria and frequency.  Musculoskeletal: Negative for arthralgias and myalgias.  Neurological: Negative for dizziness and headaches.  Psychiatric/Behavioral: Negative for dysphoric mood.       No dysphoria       Objective:    Physical Exam  Constitutional: He appears well-developed and well-nourished.  Cardiovascular: Normal rate, regular rhythm and normal heart sounds.  Pulmonary/Chest: Effort normal and breath sounds normal.  Abdominal: Soft. Bowel sounds are normal. There is no abdominal tenderness.  Psychiatric: He has a normal mood and affect. His behavior is normal.    BP 120/80 (BP Location: Left Arm, Patient Position: Sitting, Cuff Size: Normal)   Pulse 84   Temp (!) 97.1 F (36.2 C)   Ht 5\' 9"  (1.753 m)   Wt 197 lb (89.4 kg)   BMI 29.09 kg/m  Wt Readings from Last 3 Encounters:  12/03/19 197 lb (89.4 kg)     Health Maintenance Due  Topic Date Due  . TETANUS/TDAP  09/11/1952  . PNA vac Low Risk Adult (1 of 2 - PCV13) 09/11/1998    There are no preventive care reminders to display for this patient.  No results found for: TSH Lab Results  Component Value Date   WBC 7.6 12/03/2019   HGB 14.9 12/03/2019   HCT 43.4 12/03/2019   MCV 87 12/03/2019   PLT 267 12/03/2019   Lab Results  Component Value Date   NA 143 12/03/2019   K 4.8 12/03/2019   CO2 26 12/03/2019   GLUCOSE 100 (H) 12/03/2019   BUN 12 12/03/2019   CREATININE 1.00 12/03/2019   BILITOT 0.4 12/03/2019   ALKPHOS 89 12/03/2019   AST 22 12/03/2019   ALT 16 12/03/2019   PROT 6.8 12/03/2019   ALBUMIN 4.3 12/03/2019   CALCIUM 9.4 12/03/2019   Lab Results  Component Value Date   CHOL 231 (H) 12/03/2019   Lab Results  Component Value Date   HDL 55 12/03/2019   Lab Results  Component Value Date   LDLCALC 158 (H) 12/03/2019   Lab Results  Component Value Date   TRIG 99 12/03/2019   No results found for: CHOLHDL No results found for: HGBA1C    Assessment & Plan:   Problem List Items Addressed This Visit      Cardiovascular and Mediastinum   Pulmonary embolism and infarction ([place])    Continue eliquis x 6 months.       Relevant Medications   ELIQUIS 5 MG TABS tablet     Other   Memory  loss   RESOLVED: Mixed hyperlipidemia - Primary    Recommend low fat diet and exercise.  Labs drawn to day.  Recommend statin if LDL up.        Relevant Medications   ELIQUIS 5 MG TABS tablet   Other Relevant Orders   CBC with Differential/Platelet (Completed)   Lipid panel   Comp. Metabolic Panel (12)      No orders of the defined types were placed in this encounter.   Follow-up: No follow-ups  on file.    Rochel Brome, MD

## 2019-12-07 NOTE — Assessment & Plan Note (Signed)
Stable. Continue to monitor. Consider referral to neurology.

## 2019-12-07 NOTE — Assessment & Plan Note (Signed)
Recommend low fat diet and exercise.  Labs drawn to day.  Recommend statin if LDL up.

## 2019-12-07 NOTE — Assessment & Plan Note (Signed)
Continue eliquis x 6 months.

## 2020-03-09 ENCOUNTER — Ambulatory Visit: Payer: Medicare HMO | Admitting: Family Medicine

## 2020-03-15 NOTE — Progress Notes (Signed)
Subjective:  Patient ID: Mark Watkins, male    DOB: 1931-03-14  Age: 86 y.o. MRN: [id number]  Chief Complaint  Patient presents with  . Hyperlipidemia    HPI DEVYN FLOCCO presents for follow up of Hyperlipidemia. This is a chronic problem. The current episode started more than 1 year ago. Recent lipid tests were reviewed and are high. He is currently on no antihyperlipidemic treatment. Patient is eating healthy, but not exercising.   Pulmonary emboli diagnosed in fall 2020. Patient is currently on eliquis. Coagulopathy work up was negative. Denies dyspnea or cough. Patient has been on eliquis x 6 months.   Past Medical History:  Diagnosis Date  . Acute respiratory failure with hypoxia ([place])   . Age-related cognitive decline   . Benign neoplasm of cerebral meninges ([place])   . Elevated BP without diagnosis of hypertension   . Multiple subsegmental pulmonary emboli without acute cor pulmonale ([place])   . Parageusia   . Solitary pulmonary nodule    Past Surgical History:  Procedure Laterality Date  . CATARACT EXTRACTION, BILATERAL    . REPLACEMENT TOTAL KNEE BILATERAL      Family History  Problem Relation Age of Onset  . Alzheimer's disease Mother   . Osteoarthritis Mother   . Heart murmur Mother   . Heart attack Father    Social History   Socioeconomic History  . Marital status: Married    Spouse name: Not on file  . Number of children: Not on file  . Years of education: Not on file  . Highest education level: Not on file  Occupational History  . Not on file  Tobacco Use  . Smoking status: Former Smoker    Quit date: 1967    Years since quitting: 54.4  . Smokeless tobacco: Never Used  Substance and Sexual Activity  . Alcohol use: Never  . Drug use: Never  . Sexual activity: Not on file  Other Topics Concern  . Not on file  Social History Narrative  . Not on file   Social Determinants of Health   Financial Resource Strain:   . Difficulty of Paying  Living Expenses:   Food Insecurity:   . Worried About Charity fundraiser in the Last Year:   . Arboriculturist in the Last Year:   Transportation Needs:   . Film/video editor (Medical):   Marland Kitchen Lack of Transportation (Non-Medical):   Physical Activity:   . Days of Exercise per Week:   . Minutes of Exercise per Session:   Stress:   . Feeling of Stress :   Social Connections:   . Frequency of Communication with Friends and Family:   . Frequency of Social Gatherings with Friends and Family:   . Attends Religious Services:   . Active Member of Clubs or Organizations:   . Attends Archivist Meetings:   Marland Kitchen Marital Status:     Review of Systems  Constitutional: Positive for fatigue. Negative for chills, diaphoresis and fever.  HENT: Negative for congestion, ear pain and sore throat.   Respiratory: Negative for cough and shortness of breath.   Cardiovascular: Negative for chest pain and leg swelling.  Gastrointestinal: Negative for abdominal pain, constipation, diarrhea, nausea and vomiting.  Genitourinary: Negative for dysuria and urgency.  Musculoskeletal: Negative for arthralgias and myalgias.  Neurological: Negative for dizziness and headaches.  Psychiatric/Behavioral: Negative for dysphoric mood.     Objective:  BP 118/70   Pulse 76  Temp 97.9 F (36.6 C)   Resp 16   Ht 5\' 9"  (1.753 m)   Wt 200 lb 9.6 oz (91 kg)   BMI 29.62 kg/m   BP/Weight [id number]  Systolic BP 123456 123456  Diastolic BP 70 80  Wt. (Lbs) 200.6 197  BMI 29.62 29.09    Physical Exam Vitals reviewed.  Constitutional:      Appearance: Normal appearance. He is normal weight.  Cardiovascular:     Rate and Rhythm: Normal rate and regular rhythm.  Pulmonary:     Effort: Pulmonary effort is normal.     Breath sounds: Normal breath sounds.  Abdominal:     General: Abdomen is flat. Bowel sounds are normal.     Palpations: Abdomen is soft.  Neurological:     Mental Status: He is  alert and oriented to person, place, and time.  Psychiatric:        Mood and Affect: Mood normal.        Behavior: Behavior normal.    Lab Results  Component Value Date   WBC 7.8 03/17/2020   HGB 14.8 03/17/2020   HCT 45.2 03/17/2020   PLT 252 03/17/2020   GLUCOSE 105 (H) 03/17/2020   CHOL 199 03/17/2020   TRIG 75 03/17/2020   HDL 58 03/17/2020   LDLCALC 127 (H) 03/17/2020   ALT 18 03/17/2020   AST 18 03/17/2020   NA 142 03/17/2020   K 4.4 03/17/2020   CL 105 03/17/2020   CREATININE 1.09 03/17/2020   BUN 13 03/17/2020   CO2 24 03/17/2020      Assessment & Plan:  1. Mixed hyperlipidemia Recommend continue to work on eating healthy diet and exercise. Consider statin if lipids does not continue to decrease. - Lipid panel - Comprehensive metabolic panel - CBC with Differential/Platelet  2. Personal history of pulmonary embolism Stop eliquis.   3. Elevated glucose Add A1C.  4. Overweight with body mass index (BMI) of 29 to 29.9 in adult Recommend continue to work on eating healthy diet and exercise.  Orders Placed This Encounter  Procedures  . Lipid panel  . Comprehensive metabolic panel  . CBC with Differential/Platelet  . Cardiovascular Risk Assessment     Follow-up: Return in about 3 months (around 06/17/2020) for fasting.  An After Visit Summary was printed and given to the patient.  Rochel Brome [facility] [phone]

## 2020-03-17 ENCOUNTER — Ambulatory Visit (INDEPENDENT_AMBULATORY_CARE_PROVIDER_SITE_OTHER): Payer: Medicare HMO | Admitting: Family Medicine

## 2020-03-17 ENCOUNTER — Other Ambulatory Visit: Payer: Self-pay

## 2020-03-17 VITALS — BP 118/70 | HR 76 | Temp 97.9°F | Resp 16 | Ht 69.0 in | Wt 200.6 lb

## 2020-03-17 DIAGNOSIS — Z6829 Body mass index (BMI) 29.0-29.9, adult: Secondary | ICD-10-CM

## 2020-03-17 DIAGNOSIS — E663 Overweight: Secondary | ICD-10-CM

## 2020-03-17 DIAGNOSIS — R7309 Other abnormal glucose: Secondary | ICD-10-CM

## 2020-03-17 DIAGNOSIS — E782 Mixed hyperlipidemia: Secondary | ICD-10-CM | POA: Diagnosis not present

## 2020-03-17 DIAGNOSIS — R7301 Impaired fasting glucose: Secondary | ICD-10-CM | POA: Diagnosis not present

## 2020-03-17 DIAGNOSIS — Z86711 Personal history of pulmonary embolism: Secondary | ICD-10-CM

## 2020-03-18 LAB — COMPREHENSIVE METABOLIC PANEL
ALT: 18 IU/L (ref 0–44)
AST: 18 IU/L (ref 0–40)
Albumin/Globulin Ratio: 1.8 (ref 1.2–2.2)
Albumin: 4.1 g/dL (ref 3.6–4.6)
Alkaline Phosphatase: 93 IU/L (ref 48–121)
BUN/Creatinine Ratio: 12 (ref 10–24)
BUN: 13 mg/dL (ref 8–27)
Bilirubin Total: 0.5 mg/dL (ref 0.0–1.2)
CO2: 24 mmol/L (ref 20–29)
Calcium: 9.1 mg/dL (ref 8.6–10.2)
Chloride: 105 mmol/L (ref 96–106)
Creatinine, Ser: 1.09 mg/dL (ref 0.76–1.27)
GFR calc Af Amer: 71 mL/min/{1.73_m2} (ref >59)
GFR calc non Af Amer: 61 mL/min/{1.73_m2} (ref >59)
Globulin, Total: 2.3 g/dL (ref 1.5–4.5)
Glucose: 105 mg/dL — ABNORMAL HIGH (ref 65–99)
Potassium: 4.4 mmol/L (ref 3.5–5.2)
Sodium: 142 mmol/L (ref 134–144)
Total Protein: 6.4 g/dL (ref 6.0–8.5)

## 2020-03-18 LAB — CBC WITH DIFFERENTIAL/PLATELET
Basophils Absolute: 0.1 10*3/uL (ref 0.0–0.2)
Basos: 1 %
EOS (ABSOLUTE): 0.5 10*3/uL — ABNORMAL HIGH (ref 0.0–0.4)
Eos: 7 %
Hematocrit: 45.2 % (ref 37.5–51.0)
Hemoglobin: 14.8 g/dL (ref 13.0–17.7)
Immature Grans (Abs): 0 10*3/uL (ref 0.0–0.1)
Immature Granulocytes: 0 %
Lymphocytes Absolute: 2.4 10*3/uL (ref 0.7–3.1)
Lymphs: 31 %
MCH: 29.3 pg (ref 26.6–33.0)
MCHC: 32.7 g/dL (ref 31.5–35.7)
MCV: 90 fL (ref 79–97)
Monocytes Absolute: 0.7 10*3/uL (ref 0.1–0.9)
Monocytes: 9 %
Neutrophils Absolute: 4.1 10*3/uL (ref 1.4–7.0)
Neutrophils: 52 %
Platelets: 252 10*3/uL (ref 150–450)
RBC: 5.05 x10E6/uL (ref 4.14–5.80)
RDW: 14 % (ref 11.6–15.4)
WBC: 7.8 10*3/uL (ref 3.4–10.8)

## 2020-03-18 LAB — LIPID PANEL
Chol/HDL Ratio: 3.4 ratio (ref 0.0–5.0)
Cholesterol, Total: 199 mg/dL (ref 100–199)
HDL: 58 mg/dL (ref >39)
LDL Chol Calc (NIH): 127 mg/dL — ABNORMAL HIGH (ref 0–99)
Triglycerides: 75 mg/dL (ref 0–149)
VLDL Cholesterol Cal: 14 mg/dL (ref 5–40)

## 2020-03-18 LAB — CARDIOVASCULAR RISK ASSESSMENT

## 2020-03-21 ENCOUNTER — Encounter: Payer: Self-pay | Admitting: Family Medicine

## 2020-03-21 DIAGNOSIS — E663 Overweight: Secondary | ICD-10-CM | POA: Insufficient documentation

## 2020-03-21 DIAGNOSIS — R7309 Other abnormal glucose: Secondary | ICD-10-CM | POA: Insufficient documentation

## 2020-03-21 DIAGNOSIS — Z6829 Body mass index (BMI) 29.0-29.9, adult: Secondary | ICD-10-CM | POA: Insufficient documentation

## 2020-03-22 LAB — HGB A1C W/O EAG: Hgb A1c MFr Bld: 5.6 % (ref 4.8–5.6)

## 2020-03-22 LAB — SPECIMEN STATUS REPORT

## 2020-03-31 DIAGNOSIS — H52223 Regular astigmatism, bilateral: Secondary | ICD-10-CM | POA: Diagnosis not present

## 2020-03-31 DIAGNOSIS — Z961 Presence of intraocular lens: Secondary | ICD-10-CM | POA: Diagnosis not present

## 2020-03-31 DIAGNOSIS — H35353 Cystoid macular degeneration, bilateral: Secondary | ICD-10-CM | POA: Diagnosis not present

## 2020-03-31 DIAGNOSIS — H16223 Keratoconjunctivitis sicca, not specified as Sjogren's, bilateral: Secondary | ICD-10-CM | POA: Diagnosis not present

## 2020-07-27 ENCOUNTER — Ambulatory Visit (INDEPENDENT_AMBULATORY_CARE_PROVIDER_SITE_OTHER): Payer: Medicare HMO

## 2020-07-27 ENCOUNTER — Encounter: Payer: Self-pay | Admitting: Family Medicine

## 2020-07-27 DIAGNOSIS — Z23 Encounter for immunization: Secondary | ICD-10-CM

## 2020-08-10 IMAGING — MR MR HEAD W/O CM
10 series · 48 of 48 positions shown · non-contrast
Comparison: None.

CLINICAL DATA: Memory loss

EXAM:
MRI HEAD WITHOUT CONTRAST
TECHNIQUE: Multiplanar, multiecho pulse sequences of the brain and surrounding
structures were obtained without intravenous contrast.

[Series 3: T1 · sagittal · 5.0mm · 0.47mm/px · 2 of 26 slices shown]
[im 1/26]
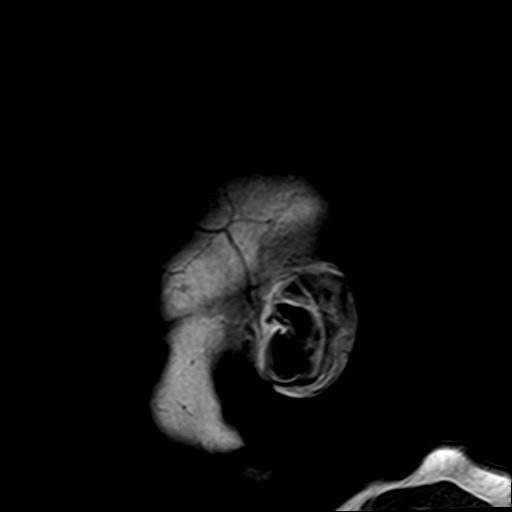
[im 26/26]
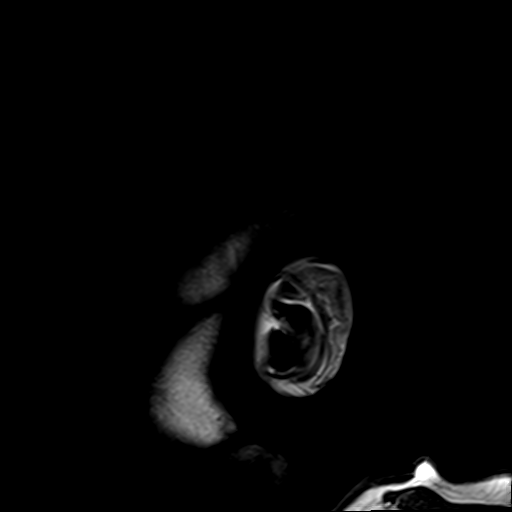

[Series 4: DWI · axial · 3.0mm · 1.88mm/px · z∈[-39,+117]mm · 9 of 108 slices shown (1 of 4)]
[im 1/108]
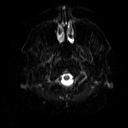
[im 14/108]
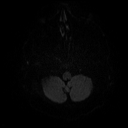
[im 27/108]
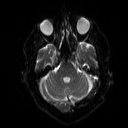
[im 41/108]
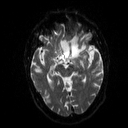
[im 54/108]
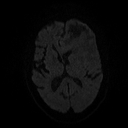
[im 67/108]
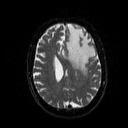
[im 81/108]
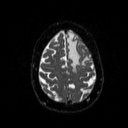
[im 94/108]
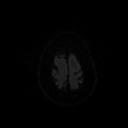
[im 108/108]
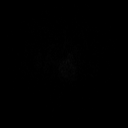

[Series 5: DWI · axial · 3.0mm · 1.88mm/px · z∈[-39,+117]mm · 4 of 54 slices shown (2 of 4)]
[im 1/54]
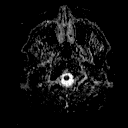
[im 18/54]
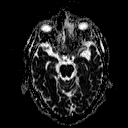
[im 36/54]
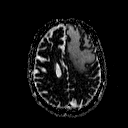
[im 54/54]
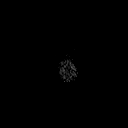

[Series 6: FLAIR · axial · 3.0mm · 0.47mm/px · z∈[-46,+114]mm · 3 of 36 slices shown]
[im 1/36]
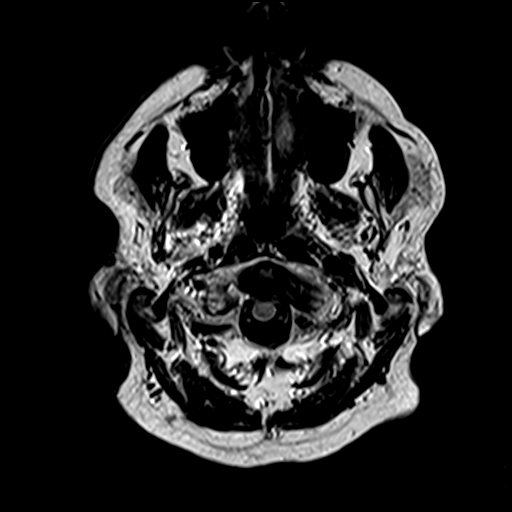
[im 18/36]
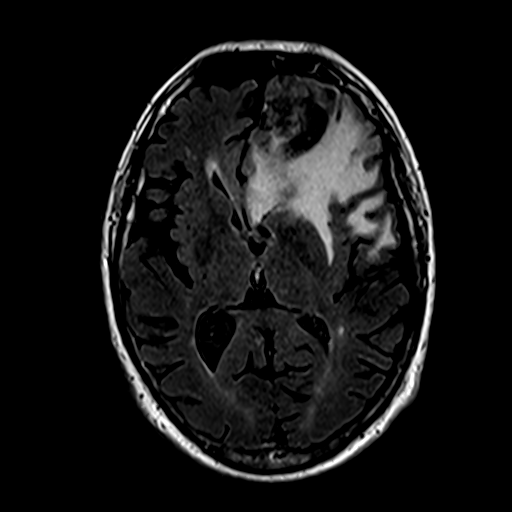
[im 36/36]
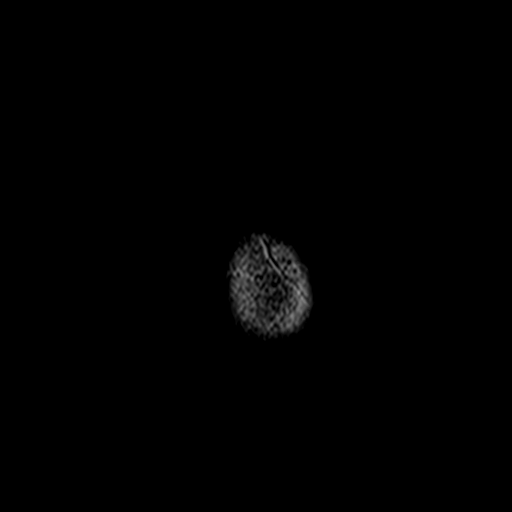

[Series 7: T2 · axial · 5.0mm · 0.62mm/px · z∈[-44,+115]mm · 2 of 25 slices shown (1 of 2)]
[im 1/25]
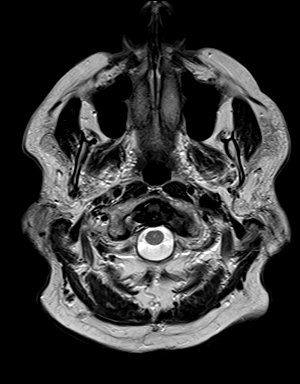
[im 25/25]
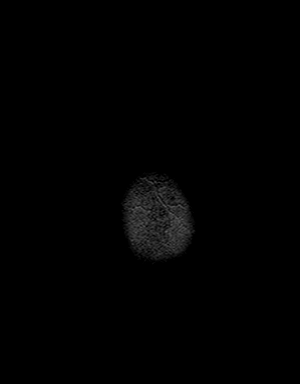

[Series 9: swi_images · axial · 5.0mm · 0.94mm/px · z∈[-42,+110]mm · 3 of 32 slices shown]
[im 1/32]
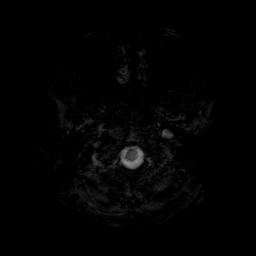
[im 16/32]
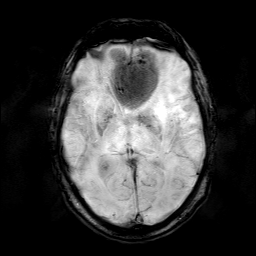
[im 32/32]
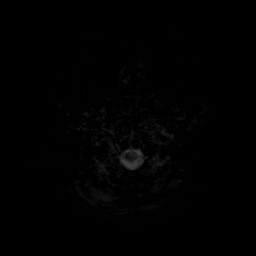

[Series 10: t1_mpr_tra · axial · 1.0mm · 0.75mm/px · z∈[-43,+113]mm · 13 of 160 slices shown]
[im 1/160]
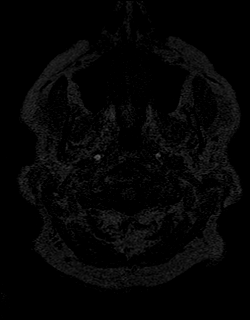
[im 14/160]
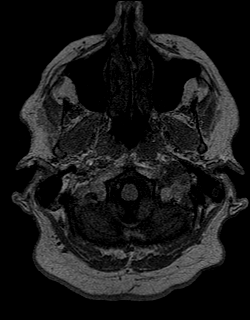
[im 27/160]
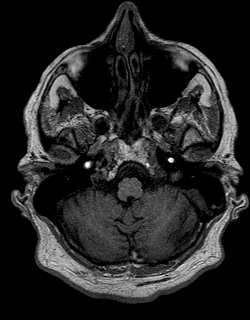
[im 40/160]
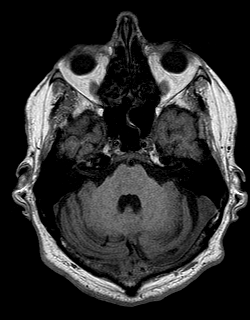
[im 54/160]
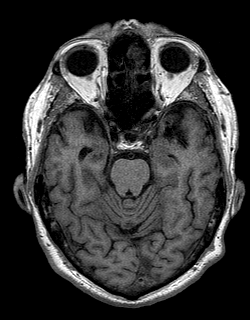
[im 67/160]
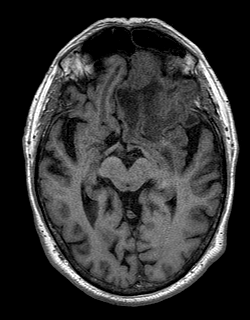
[im 80/160]
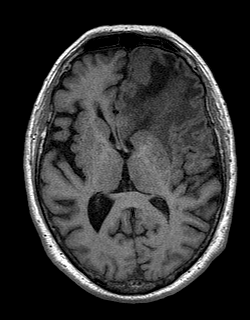
[im 93/160]
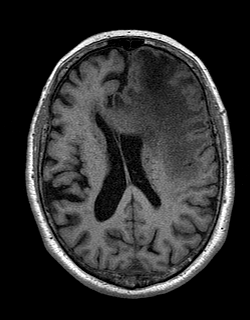
[im 107/160]
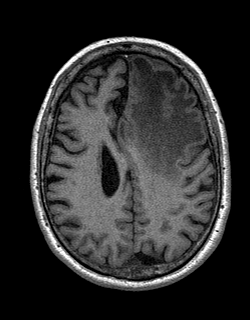
[im 120/160]
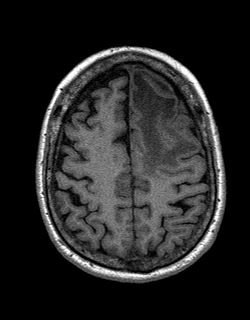
[im 133/160]
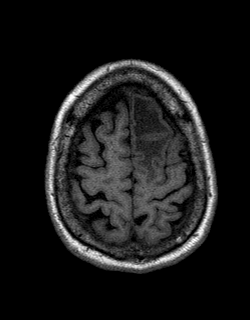
[im 146/160]
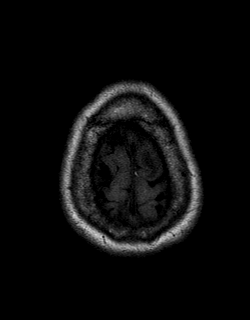
[im 160/160]
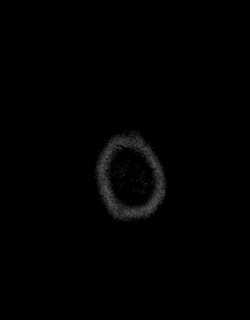

[Series 11: DWI · coronal · 5.0mm · 1.80mm/px · 6 of 76 slices shown (3 of 4)]
[im 1/76]
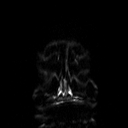
[im 16/76]
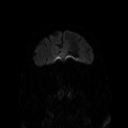
[im 31/76]
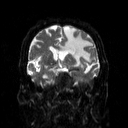
[im 46/76]
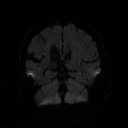
[im 61/76]
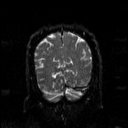
[im 76/76]
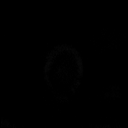

[Series 12: DWI · coronal · 5.0mm · 1.80mm/px · 3 of 39 slices shown (4 of 4)]
[im 1/39]
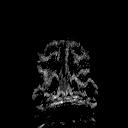
[im 20/39]
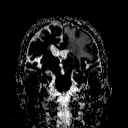
[im 39/39]
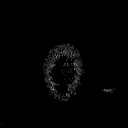

[Series 13: T2 · coronal · 5.0mm · 0.45mm/px · 3 of 32 slices shown (2 of 2)]
[im 1/32]
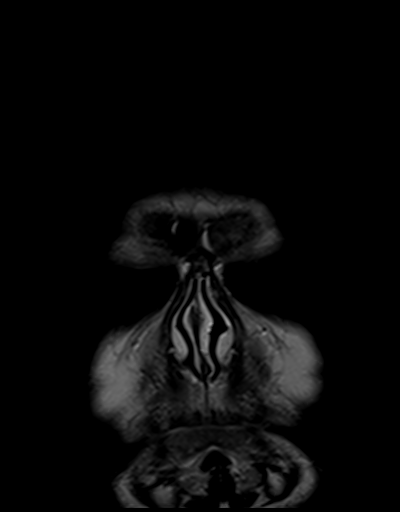
[im 16/32]
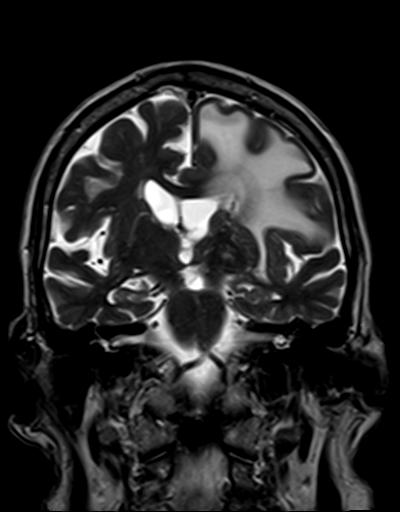
[im 32/32]
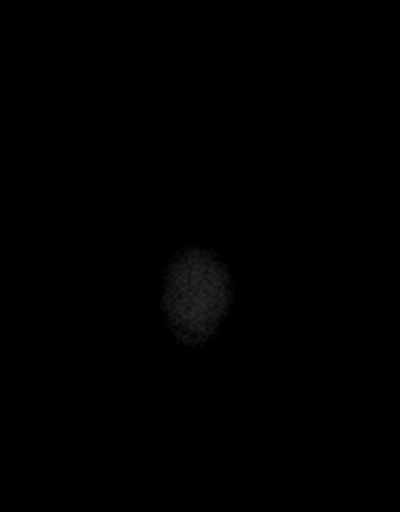

[48 of 48 positions shown; findings below may reference images not displayed]

FINDINGS: Brain: Diffusion imaging does not show any acute or subacute
infarction. The brainstem is normal. There are a few old small
vessel cerebellar infarctions. In the anteromedial corner of the
anterior cranial fossa on the left, there appears to be an
extra-axial mass lesion measuring 2.1 x 2.9 cm in the axial plane
with a cephalo caudal measurement 2.7 cm. This indents the left
frontal lobe. There is an adjacent cystic component along the
lateral margin measuring an additional 11 mm in thickness. There is
pronounced vasogenic edema throughout the left frontal lobe,
extending through the genu of the corpus callosum into the white
matter of the right frontal lobe. Mass effect results in
left-to-right shift of up to 14 mm. Elsewhere, cerebral hemispheres
show only minimal small vessel change of the white matter. No
hydrocephalus. No extra-axial fluid collection.

Vascular: Major vessels at the base of the brain show flow.

Skull and upper cervical spine: Negative

Sinuses/Orbits: There is some fluid in the left frontal ethmoid
junction region. One could question if the meningioma shows
calvarial invasion in this location. That is not definite. Orbits
are negative.

Other: None
IMPRESSION: Extra-axial tumor most consistent with a meningioma at the
anteromedial corner of the anterior cranial fossa on the left. Solid
component measures 2.9 x 2.7 x 2.1 cm. Adjacent cystic component.
Altogether, the tumor measures almost 4 cm in diameter considering
the solid and cystic components. Mass effect upon the left frontal
lobe with extensive regional vasogenic edema resulting in
left-to-right shift of up to 1.4 cm.

One could question if there could be calvarial invasion with early
extension into the left frontal ethmoid junction region. This is not
definite.

Call report in progress.

## 2020-10-02 ENCOUNTER — Encounter: Payer: Self-pay | Admitting: Family Medicine

## 2020-10-02 MED ORDER — AZITHROMYCIN 250 MG PO TABS: ORAL_TABLET | ORAL | 0 refills | Status: DC

## 2020-10-02 NOTE — Progress Notes (Signed)
Virtual Visit via Telephone Note   This visit type was conducted due to national recommendations for restrictions regarding the COVID-19 Pandemic (e.g. social distancing) in an effort to limit this patient's exposure and mitigate transmission in our community.  Due to his co-morbid illnesses, this patient is at least at moderate risk for complications without adequate follow up.  This format is felt to be most appropriate for this patient at this time.  The patient did not have access to video technology/had technical difficulties with video requiring transitioning to audio format only (telephone).  All issues noted in this document were discussed and addressed.  No physical exam could be performed with this format.  Patient verbally consented to a telehealth visit.   Date:  10/02/2020   ID:  Mark Watkins, DOB Jan 26, 1931, MRN [id number]  Patient Location: Home Provider Location: Office/Clinic  PCP:  Rochel Brome, MD   Evaluation Performed: acute visit  Chief Complaint:  fatigue  History of Present Illness:    Mark Watkins is a 86 y.o. male complaining of fatigue, sleepiness, poor appetite, poor liquid intake. Denies cough, SOB, earaches, sore throat, and fever.  The patient does have symptoms concerning for COVID-19 infection (fever, chills, cough, or new shortness of breath).   Past Medical History:  Diagnosis Date  . Acute respiratory failure with hypoxia ([place])   . Age-related cognitive decline   . Benign neoplasm of cerebral meninges ([place])   . Elevated BP without diagnosis of hypertension   . Multiple subsegmental pulmonary emboli without acute cor pulmonale ([place])   . Parageusia   . Solitary pulmonary nodule     Past Surgical History:  Procedure Laterality Date  . CATARACT EXTRACTION, BILATERAL    . REPLACEMENT TOTAL KNEE BILATERAL      Family History  Problem Relation Age of Onset  . Alzheimer's disease Mother   . Osteoarthritis Mother   . Heart murmur  Mother   . Heart attack Father     Social History   Socioeconomic History  . Marital status: Married    Spouse name: Not on file  . Number of children: Not on file  . Years of education: Not on file  . Highest education level: Not on file  Occupational History  . Not on file  Tobacco Use  . Smoking status: Former Smoker    Quit date: 1967    Years since quitting: 54.9  . Smokeless tobacco: Never Used  Substance and Sexual Activity  . Alcohol use: Never  . Drug use: Never  . Sexual activity: Not on file  Other Topics Concern  . Not on file  Social History Narrative  . Not on file   Social Determinants of Health   Financial Resource Strain:   . Difficulty of Paying Living Expenses: Not on file  Food Insecurity:   . Worried About Charity fundraiser in the Last Year: Not on file  . Ran Out of Food in the Last Year: Not on file  Transportation Needs:   . Lack of Transportation (Medical): Not on file  . Lack of Transportation (Non-Medical): Not on file  Physical Activity:   . Days of Exercise per Week: Not on file  . Minutes of Exercise per Session: Not on file  Stress:   . Feeling of Stress : Not on file  Social Connections:   . Frequency of Communication with Friends and Family: Not on file  . Frequency of Social Gatherings with Friends and Family: Not  on file  . Attends Religious Services: Not on file  . Active Member of Clubs or Organizations: Not on file  . Attends Archivist Meetings: Not on file  . Marital Status: Not on file  Intimate Partner Violence:   . Fear of Current or Ex-Partner: Not on file  . Emotionally Abused: Not on file  . Physically Abused: Not on file  . Sexually Abused: Not on file    Outpatient Medications Prior to Visit  Medication Sig Dispense Refill  . ELIQUIS 5 MG TABS tablet      No facility-administered medications prior to visit.    Allergies:   Patient has no known allergies.   Social History   Tobacco Use  .  Smoking status: Former Smoker    Quit date: 1967    Years since quitting: 54.9  . Smokeless tobacco: Never Used  Substance Use Topics  . Alcohol use: Never  . Drug use: Never     Review of Systems  Constitutional: Positive for malaise/fatigue. Negative for chills and fever.  HENT: Negative for congestion, ear pain, sinus pain and sore throat.   Respiratory: Negative for cough and shortness of breath.   Cardiovascular: Negative for chest pain.     Labs/Other Tests and Data Reviewed:    Recent Labs: 03/17/2020: ALT 18; BUN 13; Creatinine, Ser 1.09; Hemoglobin 14.8; Platelets 252; Potassium 4.4; Sodium 142   Recent Lipid Panel Lab Results  Component Value Date/Time   CHOL 199 03/17/2020 09:41 AM   TRIG 75 03/17/2020 09:41 AM   HDL 58 03/17/2020 09:41 AM   CHOLHDL 3.4 03/17/2020 09:41 AM   LDLCALC 127 (H) 03/17/2020 09:41 AM    Wt Readings from Last 3 Encounters:  03/17/20 200 lb 9.6 oz (91 kg)  12/03/19 197 lb (89.4 kg)     Objective:    Vital Signs:  Pulse 94   Temp 98.6 F (37 C)   SpO2 96%   Unable to get Bp on Sunday when I spoke with wife on the phone, but he had bounding pulses.  Physical Exam   ASSESSMENT & PLAN:   1. Other fatigue - POC COVID-19 BinaxNow positive.  2. Poor appetite Recommend fluids - POC COVID-19 BinaxNow  3. Covid 19 bronchitis Rx for zpack given after telephone visit yesterday. covid 19 came back positive. Pt informed he could discontinue zpack. - azithromycin (ZITHROMAX) 250 MG tablet; 2 DAILY FOR FIRST DAY, THEN DECREASE TO ONE DAILY FOR 4 MORE DAYS.  Dispense: 6 tablet; Refill: 0 POC covid 19 positive.  Ordered MAB infusion  COVID-19 Education: The signs and symptoms of COVID-19 were discussed with the patient and how to seek care for testing (follow up with PCP or arrange E-visit). The importance of social distancing was discussed today.   I spent 15 minutes on the phone  Follow Up:  Virtual Visit  prn  Signed,  Rochel Brome, MD  10/02/2020 11:19 AM    [name]

## 2020-10-03 ENCOUNTER — Telehealth (INDEPENDENT_AMBULATORY_CARE_PROVIDER_SITE_OTHER): Payer: Medicare HMO | Admitting: Family Medicine

## 2020-10-03 VITALS — HR 94 | Temp 98.6°F

## 2020-10-03 DIAGNOSIS — J208 Acute bronchitis due to other specified organisms: Secondary | ICD-10-CM | POA: Diagnosis not present

## 2020-10-03 DIAGNOSIS — R630 Anorexia: Secondary | ICD-10-CM

## 2020-10-03 DIAGNOSIS — R5383 Other fatigue: Secondary | ICD-10-CM | POA: Diagnosis not present

## 2020-10-03 DIAGNOSIS — U071 COVID-19: Secondary | ICD-10-CM

## 2020-10-03 LAB — POC COVID19 BINAXNOW: SARS Coronavirus 2 Ag: POSITIVE — AB

## 2020-10-03 NOTE — Progress Notes (Signed)
Results for orders placed or performed in visit on 10/03/20 (from the past 24 hour(s))  POC COVID-19 BinaxNow     Status: Abnormal   Collection Time: 10/03/20 10:28 AM  Result Value Ref Range   SARS Coronavirus 2 Ag Positive (A) Negative    Patient tested positive for COVID via rapid test.  He was told about infusion therapy and was in agreement.  He was referred to [facility] infusion clinic.

## 2020-10-04 ENCOUNTER — Other Ambulatory Visit: Payer: Self-pay | Admitting: Nurse Practitioner

## 2020-10-04 DIAGNOSIS — U071 COVID-19: Secondary | ICD-10-CM

## 2020-10-04 NOTE — Progress Notes (Signed)
I connected by phone with Mark Watkins on 10/04/2020 at 1:07 PM to discuss the potential use of a treatment for mild to moderate COVID-19 viral infection in non-hospitalized patients.  This patient is a 86 y.o. male that meets the FDA criteria for Emergency Use Authorization of bamlanivimab/etesevimab, casirivimab\imdevimab, or sotrovimab  Has a (+) direct SARS-CoV-2 viral test result  Has mild or moderate COVID-19   Is ? 86 years of age and weighs ? 40 kg  Is NOT hospitalized due to COVID-19  Is NOT requiring oxygen therapy or requiring an increase in baseline oxygen flow rate due to COVID-19  Is within 10 days of symptom onset  Has at least one of the high risk factor(s) for progression to severe COVID-19 and/or hospitalization as defined in EUA.  Specific high risk criteria : Older age (>/= 84 yo)   I have spoken and communicated the following to the patient or parent/caregiver:  1. FDA has authorized the emergency use of bamlanivimab/etesevimab, casirivimab\imdevimab, or sotrovimab for the treatment of mild to moderate COVID-19 in adults and pediatric patients with positive results of direct SARS-CoV-2 viral testing who are 46 years of age and older weighing at least 40 kg, and who are at high risk for progressing to severe COVID-19 and/or hospitalization.  2. The significant known and potential risks and benefits of bamlanivimab/etesevimab, casirivimab\imdevimab, or sotrovimab, and the extent to which such potential risks and benefits are unknown.  3. Information on available alternative treatments and the risks and benefits of those alternatives, including clinical trials.  4. Patients treated with bamlanivimab/etesevimab, casirivimab\imdevimab, or sotrovimab should continue to self-isolate and use infection control measures (e.g., wear mask, isolate, social distance, avoid sharing personal items, clean and disinfect "high touch" surfaces, and frequent handwashing) according to  CDC guidelines.   5. The patient or parent/caregiver has the option to accept or refuse bamlanivimab/etesevimab, casirivimab\imdevimab, or sotrovimab.  After reviewing this information with the patient, the patient has agreed to receive one of the available covid 19 monoclonal antibodies and will be provided an appropriate fact sheet prior to infusion.Beckey Rutter, [place], AGNP-C [phone] ([place])

## 2020-10-05 ENCOUNTER — Ambulatory Visit (HOSPITAL_COMMUNITY)
Admission: RE | Admit: 2020-10-05 | Discharge: 2020-10-05 | Disposition: A | Discharge status: Home / Self Care | Payer: Medicare Other | Source: Ambulatory Visit | Attending: Pulmonary Disease | Admitting: Pulmonary Disease

## 2020-10-05 DIAGNOSIS — U071 COVID-19: Secondary | ICD-10-CM | POA: Diagnosis present

## 2020-10-05 DIAGNOSIS — Z23 Encounter for immunization: Secondary | ICD-10-CM | POA: Diagnosis not present

## 2020-10-05 LAB — SARS-COV-2, NAA 2 DAY TAT

## 2020-10-05 LAB — NOVEL CORONAVIRUS, NAA: SARS-CoV-2, NAA: DETECTED — AB

## 2020-10-05 MED ORDER — ALBUTEROL SULFATE HFA 108 (90 BASE) MCG/ACT IN AERS: 2.0000 | INHALATION_SPRAY | Freq: Once | RESPIRATORY_TRACT | Status: DC | PRN

## 2020-10-05 MED ORDER — EPINEPHRINE 0.3 MG/0.3ML IJ SOAJ: 0.3000 mg | Freq: Once | INTRAMUSCULAR | Status: DC | PRN

## 2020-10-05 MED ORDER — METHYLPREDNISOLONE SODIUM SUCC 125 MG IJ SOLR: 125.0000 mg | Freq: Once | INTRAMUSCULAR | Status: DC | PRN

## 2020-10-05 MED ORDER — FAMOTIDINE IN NACL 20-0.9 MG/50ML-% IV SOLN: 20.0000 mg | Freq: Once | INTRAVENOUS | Status: DC | PRN

## 2020-10-05 MED ORDER — DIPHENHYDRAMINE HCL 50 MG/ML IJ SOLN: 50.0000 mg | Freq: Once | INTRAMUSCULAR | Status: DC | PRN

## 2020-10-05 MED ORDER — SODIUM CHLORIDE 0.9 % IV SOLN
1200.0000 mg | Freq: Once | INTRAVENOUS | Status: AC
  Administered 2020-10-05: 1200 mg via INTRAVENOUS

## 2020-10-05 MED ORDER — SODIUM CHLORIDE 0.9 % IV SOLN: INTRAVENOUS | Status: DC | PRN

## 2020-10-05 NOTE — Progress Notes (Signed)
Patient reviewed Fact Sheet for Patients, Parents, and Caregivers for Emergency Use Authorization (EUA) of Regen-Cov2 for the Treatment of Coronavirus.  Patient also reviewed and is agreeable to the estimated cost of treatment.  Patient is agreeable to proceed.

## 2020-10-05 NOTE — Discharge Instructions (Signed)
10 Things You Can Do to Manage Your COVID-19 Symptoms at Home If you have possible or confirmed COVID-19: 1. Stay home from work and school. And stay away from other public places. If you must go out, avoid using any kind of public transportation, ridesharing, or taxis. 2. Monitor your symptoms carefully. If your symptoms get worse, call your healthcare provider immediately. 3. Get rest and stay hydrated. 4. If you have a medical appointment, call the healthcare provider ahead of time and tell them that you have or may have COVID-19. 5. For medical emergencies, call 911 and notify the dispatch personnel that you have or may have COVID-19. 6. Cover your cough and sneezes with a tissue or use the inside of your elbow. 7. Wash your hands often with soap and water for at least 20 seconds or clean your hands with an alcohol-based hand sanitizer that contains at least 60% alcohol. 8. As much as possible, stay in a specific room and away from other people in your home. Also, you should use a separate bathroom, if available. If you need to be around other people in or outside of the home, wear a mask. 9. Avoid sharing personal items with other people in your household, like dishes, towels, and bedding. 10. Clean all surfaces that are touched often, like counters, tabletops, and doorknobs. Use household cleaning sprays or wipes according to the label instructions. cdc.gov/coronavirus 04/29/2019 This information is not intended to replace advice given to you by your health care provider. Make sure you discuss any questions you have with your health care provider. Document Revised: 10/01/2019 Document Reviewed: 10/01/2019 Elsevier Patient Education  2020 [employer] What types of side effects do monoclonal antibody drugs cause?  Common side effects  In general, the more common side effects caused by monoclonal antibody drugs include: . Allergic reactions, such as hives or itching . Flu-like signs and  symptoms, including chills, fatigue, fever, and muscle aches and pains . Nausea, vomiting . Diarrhea . Skin rashes . Low blood pressure   The CDC is recommending patients who receive monoclonal antibody treatments wait at least 90 days before being vaccinated.  Currently, there are no data on the safety and efficacy of mRNA COVID-19 vaccines in persons who received monoclonal antibodies or convalescent plasma as part of COVID-19 treatment. Based on the estimated half-life of such therapies as well as evidence suggesting that reinfection is uncommon in the 90 days after initial infection, vaccination should be deferred for at least 90 days, as a precautionary measure until additional information becomes available, to avoid interference of the antibody treatment with vaccine-induced immune responses. If you have any questions or concerns after the infusion please call the Advanced Practice Provider on call at [phone]. This number is ONLY intended for your use regarding questions or concerns about the infusion post-treatment side-effects.  Please do not provide this number to others for use. For return to work notes please contact your primary care provider.   If someone you know is interested in receiving treatment please have them call the COVID hotline at [phone].   

## 2020-10-05 NOTE — Progress Notes (Signed)
  Diagnosis: COVID-19  Physician: Dr. Joya Gaskins  Procedure: [facility] Med: casirivimab\imdevimab infusion - Provided patient with casirivimab\imdevimab fact sheet for patients, parents and caregivers prior to infusion.  Complications: No immediate complications noted.  Discharge: Discharged home   Mark Watkins 10/05/2020

## 2020-10-13 ENCOUNTER — Ambulatory Visit: Payer: Medicare HMO | Admitting: Family Medicine

## 2021-04-05 DIAGNOSIS — H16223 Keratoconjunctivitis sicca, not specified as Sjogren's, bilateral: Secondary | ICD-10-CM | POA: Diagnosis not present

## 2021-04-05 DIAGNOSIS — Z961 Presence of intraocular lens: Secondary | ICD-10-CM | POA: Diagnosis not present

## 2021-04-05 DIAGNOSIS — H52223 Regular astigmatism, bilateral: Secondary | ICD-10-CM | POA: Diagnosis not present

## 2021-04-05 DIAGNOSIS — H353131 Nonexudative age-related macular degeneration, bilateral, early dry stage: Secondary | ICD-10-CM | POA: Diagnosis not present

## 2021-04-05 DIAGNOSIS — H35353 Cystoid macular degeneration, bilateral: Secondary | ICD-10-CM | POA: Diagnosis not present

## 2021-06-08 ENCOUNTER — Ambulatory Visit: Payer: Medicare Other

## 2021-06-15 ENCOUNTER — Ambulatory Visit (INDEPENDENT_AMBULATORY_CARE_PROVIDER_SITE_OTHER): Payer: Medicare HMO

## 2021-06-15 ENCOUNTER — Other Ambulatory Visit: Payer: Self-pay

## 2021-06-15 VITALS — BP 142/60 | HR 68 | Resp 16 | Ht 69.0 in | Wt 201.2 lb

## 2021-06-15 DIAGNOSIS — Z6829 Body mass index (BMI) 29.0-29.9, adult: Secondary | ICD-10-CM

## 2021-06-15 DIAGNOSIS — Z0000 Encounter for general adult medical examination without abnormal findings: Secondary | ICD-10-CM

## 2021-06-15 NOTE — Patient Instructions (Signed)
Fall Prevention in the Home, Adult Falls can cause injuries and can happen to people of all ages. There are many things you can do to make your home safe and to help prevent falls. Ask forhelp when making these changes. What actions can I take to prevent falls? General Instructions Use good lighting in all rooms. Replace any light bulbs that burn out. Turn on the lights in dark areas. Use night-lights. Keep items that you use often in easy-to-reach places. Lower the shelves around your home if needed. Set up your furniture so you have a clear path. Avoid moving your furniture around. Do not have throw rugs or other things on the floor that can make you trip. Avoid walking on wet floors. If any of your floors are uneven, fix them. Add color or contrast paint or tape to clearly mark and help you see: Grab bars or handrails. First and last steps of staircases. Where the edge of each step is. If you use a stepladder: Make sure that it is fully opened. Do not climb a closed stepladder. Make sure the sides of the stepladder are locked in place. Ask someone to hold the stepladder while you use it. Know where your pets are when moving through your home. What can I do in the bathroom?     Keep the floor dry. Clean up any water on the floor right away. Remove soap buildup in the tub or shower. Use nonskid mats or decals on the floor of the tub or shower. Attach bath mats securely with double-sided, nonslip rug tape. If you need to sit down in the shower, use a plastic, nonslip stool. Install grab bars by the toilet and in the tub and shower. Do not use towel bars as grab bars. What can I do in the bedroom? Make sure that you have a light by your bed that is easy to reach. Do not use any sheets or blankets for your bed that hang to the floor. Have a firm chair with side arms that you can use for support when you get dressed. What can I do in the kitchen? Clean up any spills right away. If  you need to reach something above you, use a step stool with a grab bar. Keep electrical cords out of the way. Do not use floor polish or wax that makes floors slippery. What can I do with my stairs? Do not leave any items on the stairs. Make sure that you have a light switch at the top and the bottom of the stairs. Make sure that there are handrails on both sides of the stairs. Fix handrails that are broken or loose. Install nonslip stair treads on all your stairs. Avoid having throw rugs at the top or bottom of the stairs. Choose a carpet that does not hide the edge of the steps on the stairs. Check carpeting to make sure that it is firmly attached to the stairs. Fix carpet that is loose or worn. What can I do on the outside of my home? Use bright outdoor lighting. Fix the edges of walkways and driveways and fix any cracks. Remove anything that might make you trip as you walk through a door, such as a raised step or threshold. Trim any bushes or trees on paths to your home. Check to see if handrails are loose or broken and that both sides of all steps have handrails. Install guardrails along the edges of any raised decks and porches. Clear paths of anything that   can make you trip, such as tools or rocks. Have leaves, snow, or ice cleared regularly. Use sand or salt on paths during winter. Clean up any spills in your garage right away. This includes grease or oil spills. What other actions can I take? Wear shoes that: Have a low heel. Do not wear high heels. Have rubber bottoms. Feel good on your feet and fit well. Are closed at the toe. Do not wear open-toe sandals. Use tools that help you move around if needed. These include: Canes. Walkers. Scooters. Crutches. Review your medicines with your doctor. Some medicines can make you feel dizzy. This can increase your chance of falling. Ask your doctor what else you can do to help prevent falls. Where to find more information Centers  for Disease Control and Prevention, [employer]: http://www.wolf.info/ National Institute on Aging: http://kim-miller.com/ Contact a doctor if: You are afraid of falling at home. You feel weak, drowsy, or dizzy at home. You fall at home. Summary There are many simple things that you can do to make your home safe and to help prevent falls. Ways to make your home safe include removing things that can make you trip and installing grab bars in the bathroom. Ask for help when making these changes in your home. This information is not intended to replace advice given to you by your health care provider. Make sure you discuss any questions you have with your healthcare provider. Document Revised: 05/18/2020 Document Reviewed: 05/18/2020 Elsevier Patient Education  [place] Maintenance, Male Adopting a healthy lifestyle and getting preventive care are important in promoting health and wellness. Ask your health care provider about: The right schedule for you to have regular tests and exams. Things you can do on your own to prevent diseases and keep yourself healthy. What should I know about diet, weight, and exercise? Eat a healthy diet  Eat a diet that includes plenty of vegetables, fruits, low-fat dairy products, and lean protein. Do not eat a lot of foods that are high in solid fats, added sugars, or sodium.  Maintain a healthy weight Body mass index (BMI) is a measurement that can be used to identify possible weight problems. It estimates body fat based on height and weight. Your health care provider can help determine your BMI and help you achieve or maintain ahealthy weight. Get regular exercise Get regular exercise. This is one of the most important things you can do for your health. Most adults should: Exercise for at least 150 minutes each week. The exercise should increase your heart rate and make you sweat (moderate-intensity exercise). Do strengthening exercises at least twice a week.  This is in addition to the moderate-intensity exercise. Spend less time sitting. Even light physical activity can be beneficial. Watch cholesterol and blood lipids Have your blood tested for lipids and cholesterol at 85 years of age, then havethis test every 5 years. You may need to have your cholesterol levels checked more often if: Your lipid or cholesterol levels are high. You are older than 85 years of age. You are at high risk for heart disease. What should I know about cancer screening? Many types of cancers can be detected early and may often be prevented. Depending on your health history and family history, you may need to have cancer screening at various ages. This may include screening for: Colorectal cancer. Prostate cancer. Skin cancer. Lung cancer. What should I know about heart disease, diabetes, and high blood pressure? Blood pressure and heart  disease High blood pressure causes heart disease and increases the risk of stroke. This is more likely to develop in people who have high blood pressure readings, are of African descent, or are overweight. Talk with your health care provider about your target blood pressure readings. Have your blood pressure checked: Every 3-5 years if you are 49-34 years of age. Every year if you are 75 years old or older. If you are between the ages of 58 and 91 and are a current or former smoker, ask your health care provider if you should have a one-time screening for abdominal aortic aneurysm (AAA). Diabetes Have regular diabetes screenings. This checks your fasting blood sugar level. Have the screening done: Once every three years after age 63 if you are at a normal weight and have a low risk for diabetes. More often and at a younger age if you are overweight or have a high risk for diabetes. What should I know about preventing infection? Hepatitis B If you have a higher risk for hepatitis B, you should be screened for this virus. Talk with  your health care provider to find out if you are at risk forhepatitis B infection. Hepatitis C Blood testing is recommended for: Everyone born from [birth year] through 1963. Anyone with known risk factors for hepatitis C. Sexually transmitted infections (STIs) You should be screened each year for STIs, including gonorrhea and chlamydia, if: You are sexually active and are younger than 85 years of age. You are older than 85 years of age and your health care provider tells you that you are at risk for this type of infection. Your sexual activity has changed since you were last screened, and you are at increased risk for chlamydia or gonorrhea. Ask your health care provider if you are at risk. Ask your health care provider about whether you are at high risk for HIV. Your health care provider may recommend a prescription medicine to help prevent HIV infection. If you choose to take medicine to prevent HIV, you should first get tested for HIV. You should then be tested every 3 months for as long as you are taking the medicine. Follow these instructions at home: Lifestyle Do not use any products that contain nicotine or tobacco, such as cigarettes, e-cigarettes, and chewing tobacco. If you need help quitting, ask your health care provider. Do not use street drugs. Do not share needles. Ask your health care provider for help if you need support or information about quitting drugs. Alcohol use Do not drink alcohol if your health care provider tells you not to drink. If you drink alcohol: Limit how much you have to 0-2 drinks a day. Be aware of how much alcohol is in your drink. In the [place] one drink equals one 12 oz bottle of beer (355 mL), one 5 oz glass of wine (148 mL), or one 1 oz glass of hard liquor (44 mL). General instructions Schedule regular health, dental, and eye exams. Stay current with your vaccines. Tell your health care provider if: You often feel depressed. You have ever been abused or  do not feel safe at home. Summary Adopting a healthy lifestyle and getting preventive care are important in promoting health and wellness. Follow your health care provider's instructions about healthy diet, exercising, and getting tested or screened for diseases. Follow your health care provider's instructions on monitoring your cholesterol and blood pressure. This information is not intended to replace advice given to you by your health care provider. Make sure  you discuss any questions you have with your healthcare provider. Document Revised: 10/08/2018 Document Reviewed: 10/08/2018 Elsevier Patient Education  2022 [employer].

## 2021-06-15 NOTE — Progress Notes (Signed)
Subjective:   Mark Watkins is a 87 y.o. male who presents for Medicare Annual/Subsequent preventive examination.  This wellness visit is conducted by a nurse.  The patient's medications were reviewed and reconciled since the patient's last visit.  History details were provided by the patient.  The history appears to be reliable.    Patient's last AWV was more than one year ago.   Medical History: Patient history and Family history was reviewed  Medications, Allergies, and preventative health maintenance was reviewed and updated.   Review of Systems    ROS - NEGATIVE  Cardiac Risk Factors include: advanced age (>37mn, >>32women);male gender     Objective:    Today's Vitals   06/15/21 1019  BP: (!) 142/60  Pulse: 68  Resp: 16  SpO2: 92%  Weight: 201 lb 3.2 oz (91.3 kg)  Height: '5\' 9"'$  (1.753 m)  PainSc: 0-No pain   Body mass index is 29.71 kg/m.  Advanced Directives 06/15/2021  Does Patient Have a Medical Advance Directive? Yes  Type of AParamed[place]of [place]  Does patient want to make changes to medical advance directive? No - Patient declined  Copy of [place]? No - copy requested    Current Medications (verified) Outpatient Encounter Medications as of 06/15/2021  Medication Sig   Multiple Vitamin (MULTIVITAMIN ADULT PO) Take 1 tablet by mouth daily.   [DISCONTINUED] azithromycin (ZITHROMAX) 250 MG tablet 2 DAILY FOR FIRST DAY, THEN DECREASE TO ONE DAILY FOR 4 MORE DAYS. (Patient not taking: Reported on 06/15/2021)   [DISCONTINUED] ELIQUIS 5 MG TABS tablet    No facility-administered encounter medications on file as of 06/15/2021.    Allergies (verified) Patient has no known allergies.   History: Past Medical History:  Diagnosis Date   Acute respiratory failure with hypoxia (HCC)    Age-related cognitive decline    Benign neoplasm of cerebral meninges (HCC)    Elevated BP without diagnosis of  hypertension    Multiple subsegmental pulmonary emboli without acute cor pulmonale (HCC)    Parageusia    Solitary pulmonary nodule    Past Surgical History:  Procedure Laterality Date   CATARACT EXTRACTION, BILATERAL     REPLACEMENT TOTAL KNEE BILATERAL     Family History  Problem Relation Age of Onset   Alzheimer's disease Mother    Osteoarthritis Mother    Heart murmur Mother    Heart attack Father    Social History   Socioeconomic History   Marital status: Married    Spouse name: GShirlee Limerick  Number of children: 2   Years of education: Not on file   Highest education level: Not on file  Occupational History   Not on file  Tobacco Use   Smoking status: Former    Types: Cigarettes    Quit date: 1967    Years since quitting: 55.6   Smokeless tobacco: Never  Vaping Use   Vaping Use: Never used  Substance and Sexual Activity   Alcohol use: Never   Drug use: Never   Sexual activity: Not on file  Other Topics Concern   Not on file  Social History Narrative   Works part time Monday, Tuesday, 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[employer]  Social Determinants of Health   Financial Resource Strain: Not on file  Food Insecurity: No Food Insecurity   Worried About RCharity fundraiserin the Last Year: Never true   [employer]  of Food in the Last Year: Never true  Transportation Needs: No Transportation Needs   Lack of Transportation (Medical): No   Lack of Transportation (Non-Medical): No  Physical Activity: Not on file  Stress: Not on file  Social Connections: Not on file    Tobacco Counseling Counseling given: Does not use tobacco products   Clinical Intake:  Pre-visit preparation completed: Yes  Pain : No/denies pain Pain Score: 0-No pain   BMI - recorded: 29.71 Nutritional Status: BMI 25 -29 Overweight Nutritional Risks: None Diabetes: No How often do you need to have someone help you when you read instructions, pamphlets, or other written materials  from your doctor or pharmacy?: 1 - Never Interpreter Needed?: No   Activities of Daily Living In your present state of health, do you have any difficulty performing the following activities: 06/15/2021  Hearing? N  Vision? N  Difficulty concentrating or making decisions? N  Walking or climbing stairs? N  Dressing or bathing? N  Doing errands, shopping? N  Preparing Food and eating ? N  Using the Toilet? N  In the past six months, have you accidently leaked urine? N  Do you have problems with loss of bowel control? N  Managing your Medications? N  Managing your Finances? N  Housekeeping or managing your Housekeeping? N  Some recent data might be hidden    Patient Care Team: Rochel Brome, MD as PCP - General (Family Medicine) Lynnell Dike, OD (Optometry)     Assessment:   This is a routine wellness examination for Mark Watkins.  Dietary issues and exercise activities discussed: Current Exercise Habits: The patient does not participate in regular exercise at present, Exercise limited by: None identified  Depression Screen PHQ 2/9 Scores 06/15/2021 03/17/2020  PHQ - 2 Score 0 0    Fall Risk Fall Risk  06/15/2021 03/17/2020  Falls in the past year? 0 0  Number falls in past yr: 0 0  Injury with Fall? 0 -  Risk for fall due to : No Fall Risks -  Follow up Falls evaluation completed -   ASSISTIVE DEVICES UTILIZED TO PREVENT FALLS: Use of a cane, walker or w/c? No  Grab bars in the bathroom? No  Shower chair or bench in shower? Yes  Elevated toilet seat or a handicapped toilet? No   Gait steady and fast without use of assistive device  Cognitive Function:     6CIT Screen 06/15/2021  What Year? 0 points  What month? 0 points  What time? 0 points  Count back from 20 0 points  Months in reverse 0 points  Repeat phrase 0 points  Total Score 0    Immunizations Immunization History  Administered Date(s) Administered   Fluad Quad(high Dose 65+) 07/27/2020   Janssen (J&J)  SARS-COV-2 Vaccination 01/25/2020   Pneumococcal Conjugate-13 11/13/2017   Pneumococcal Polysaccharide-23 11/20/2018    TDAP status: Due, Education has been provided regarding the importance of this vaccine. Advised may receive this vaccine at local pharmacy or Health Dept. Aware to provide a copy of the vaccination record if obtained from local pharmacy or Health Dept. Verbalized acceptance and understanding.  Flu Vaccine status: Due, Education has been provided regarding the importance of this vaccine. Advised may receive this vaccine at local pharmacy or Health Dept. Aware to provide a copy of the vaccination record if obtained from local pharmacy or Health Dept. Verbalized acceptance and understanding.  Pneumococcal vaccine status: Up to date  Covid-19 vaccine status: Declined, Education has  been provided regarding the importance of this vaccine but patient still declined. Advised may receive this vaccine at local pharmacy or Health Dept.or vaccine clinic. Aware to provide a copy of the vaccination record if obtained from local pharmacy or Health Dept. Verbalized acceptance and understanding.  Qualifies for Shingles Vaccine? Yes   Zostavax completed No   Shingrix Completed?: No.    Education has been provided regarding the importance of this vaccine. Patient has been advised to call insurance company to determine out of pocket expense if they have not yet received this vaccine. Advised may also receive vaccine at local pharmacy or Health Dept. Verbalized acceptance and understanding.  Screening Tests Health Maintenance  Topic Date Due   TETANUS/TDAP  Never done   Zoster Vaccines- Shingrix (1 of 2) Never done   INFLUENZA VACCINE  05/29/2021   COVID-19 Vaccine (2 - Booster for Janssen series) 07/01/2021 (Originally 03/21/2020)   PNA vac Low Risk Adult  Completed   HPV VACCINES  Aged Out    Health Maintenance  Health Maintenance Due  Topic Date Due   TETANUS/TDAP  Never done   Zoster  Vaccines- Shingrix (1 of 2) Never done   INFLUENZA VACCINE  05/29/2021    Colorectal cancer screening: No longer required.   Additional Screening:  Vision Screening: Recommended annual ophthalmology exams for early detection of glaucoma and other disorders of the eye. Is the patient up to date with their annual eye exam?  Yes  Who is the provider or what is the name of the office in which the patient attends annual eye exams? Dr Renaldo Fiddler, [place]: Recommended annual dental exams for proper oral hygiene    Plan:    1- Flu Vaccine recommended, patient will get in the next couple of months 2- Colorectal Cancer Screening - Cologuard was NEGATIVE 11/27/17 -- no further screenings recommended due to age 63- Healthy diet and exercise recommended 4- Fasting appointment recommended - patient prefers to see Dr Tobie Poet.  Front staff made aware and will contact him with next available appointment  I have personally reviewed and noted the following in the patient's chart:   Medical and social history Use of alcohol, tobacco or illicit drugs  Current medications and supplements including opioid prescriptions. Patient is not currently taking opioid prescriptions. Functional ability and status Nutritional status Physical activity Advanced directives List of other physicians Hospitalizations, surgeries, and ER visits in previous 12 months Vitals Screenings to include cognitive, depression, and falls Referrals and appointments  In addition, I have reviewed and discussed with patient certain preventive protocols, quality metrics, and best practice recommendations. A written personalized care plan for preventive services as well as general preventive health recommendations were provided to patient.     Erie Noe, LPN   [id number]

## 2021-07-12 ENCOUNTER — Encounter: Payer: Self-pay | Admitting: Family Medicine

## 2021-07-12 ENCOUNTER — Other Ambulatory Visit: Payer: Medicare HMO

## 2021-07-12 ENCOUNTER — Other Ambulatory Visit: Payer: Self-pay

## 2021-07-12 ENCOUNTER — Ambulatory Visit (INDEPENDENT_AMBULATORY_CARE_PROVIDER_SITE_OTHER): Payer: Medicare HMO | Admitting: Family Medicine

## 2021-07-12 VITALS — BP 130/60 | HR 80 | Temp 96.7°F | Resp 16 | Ht 69.0 in | Wt 200.6 lb

## 2021-07-12 DIAGNOSIS — Z23 Encounter for immunization: Secondary | ICD-10-CM

## 2021-07-12 DIAGNOSIS — R7309 Other abnormal glucose: Secondary | ICD-10-CM

## 2021-07-12 DIAGNOSIS — M791 Myalgia, unspecified site: Secondary | ICD-10-CM | POA: Diagnosis not present

## 2021-07-12 DIAGNOSIS — E663 Overweight: Secondary | ICD-10-CM | POA: Diagnosis not present

## 2021-07-12 DIAGNOSIS — E782 Mixed hyperlipidemia: Secondary | ICD-10-CM

## 2021-07-12 DIAGNOSIS — T466X5A Adverse effect of antihyperlipidemic and antiarteriosclerotic drugs, initial encounter: Secondary | ICD-10-CM | POA: Diagnosis not present

## 2021-07-12 DIAGNOSIS — Z6829 Body mass index (BMI) 29.0-29.9, adult: Secondary | ICD-10-CM

## 2021-07-12 DIAGNOSIS — D329 Benign neoplasm of meninges, unspecified: Secondary | ICD-10-CM | POA: Diagnosis not present

## 2021-07-12 NOTE — Progress Notes (Signed)
Subjective:  Patient ID: Mark Watkins, male    DOB: Aug 10, 1931  Age: 87 y.o. MRN: [id number]  Chief Complaint  Patient presents with   Hyperlipidemia    Hyperlipidemia Mixed hyperlipidemia  Pt presents with hyperlipidemia. Patient has not had lipids checked since 2021. At that time his LDL was 127. His trigs and hdl were at goal.  He eats fairly healthy. He is active. He works for a [employer], newspaper 3 days per week.   Pre-Diabetes Patients last A1C was 5.6.   Left sided meningioma with vasogenic edema found incidentally with memory loss work up..  Pt saw Dr. Sherley Bounds in 07/2019 and pt was recommended to return for follow up with him and repeat mri in 3 month. Per Dr. Ronnald Ramp' note pt reported he would not want surgery or any intervention, even if this was cancer. The patient's family continues to have concerns about his memory. Previous work up has been normal.   Current Outpatient Medications on File Prior to Visit  Medication Sig Dispense Refill   Multiple Vitamin (MULTIVITAMIN ADULT PO) Take 1 tablet by mouth daily.     No current facility-administered medications on file prior to visit.   Past Medical History:  Diagnosis Date   Acute respiratory failure with hypoxia (HCC)    Age-related cognitive decline    Benign neoplasm of cerebral meninges (HCC)    Elevated BP without diagnosis of hypertension    Multiple subsegmental pulmonary emboli without acute cor pulmonale (HCC)    Parageusia    Solitary pulmonary nodule    Past Surgical History:  Procedure Laterality Date   CATARACT EXTRACTION, BILATERAL     REPLACEMENT TOTAL KNEE BILATERAL      Family History  Problem Relation Age of Onset   Alzheimer's disease Mother    Osteoarthritis Mother    Heart murmur Mother    Heart attack Father    Social History   Socioeconomic History   Marital status: Married    Spouse name: Shirlee Limerick   Number of children: 2   Years of education: Not on file   Highest  education level: Not on file  Occupational History   Not on file  Tobacco Use   Smoking status: Former    Types: Cigarettes    Quit date: 1967    Years since quitting: 55.7   Smokeless tobacco: Never  Vaping Use   Vaping Use: Never used  Substance and Sexual Activity   Alcohol use: Never   Drug use: Never   Sexual activity: Not on file  Other Topics Concern   Not on file  Social History Narrative   Works part time Monday, Tuesday, 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  HENT:  Negative for congestion, rhinorrhea and sore throat.   Respiratory:  Negative for cough and shortness of breath.   Cardiovascular:  Negative for chest pain and palpitations.  Gastrointestinal:  Negative for  abdominal pain, constipation, diarrhea, nausea and vomiting.  Genitourinary:  Negative for dysuria and urgency.  Musculoskeletal:  Negative for arthralgias, back pain and myalgias.  Neurological:  Negative for dizziness and headaches.  Psychiatric/Behavioral:  Negative for dysphoric mood. The patient is not nervous/anxious.     Objective:  BP 130/60   Pulse 80   Temp (!) 96.7 F (35.9 C)   Resp 16   Ht '5\' 9"'$  (1.753 m)   Wt 200 lb 9.6 oz (91 kg)   BMI 29.62 kg/m   BP/Weight 07/12/2021 06/15/2021 [id number]  Systolic BP [id number]  Diastolic BP 60 60 81  Wt. (Lbs) 200.6 201.2 -  BMI 29.62 29.71 -    Physical Exam Vitals reviewed.  Constitutional:      Appearance:  Normal appearance.  HENT:     Right Ear: Tympanic membrane and external ear normal.     Left Ear: Tympanic membrane and external ear normal.     Nose: Nose normal.     Mouth/Throat:     Mouth: Mucous membranes are moist.  Eyes:     Pupils: Pupils are equal, round, and reactive to light.  Cardiovascular:     Rate and Rhythm: Normal rate and regular rhythm.     Pulses: Normal pulses.     Heart sounds: Normal heart sounds.  Pulmonary:     Breath sounds: Normal breath sounds.  Abdominal:     General: Abdomen is flat. Bowel sounds are normal.     Palpations: Abdomen is soft.  Musculoskeletal:        General: Normal range of motion.     Cervical back: Normal range of motion.  Skin:    General: Skin is warm and dry.  Neurological:     Mental Status: He is alert and oriented to person, place, and time.  Psychiatric:        Mood and Affect: Mood normal.        Behavior: Behavior normal.    Diabetic Foot Exam - Simple   No data filed      Lab Results  Component Value Date   WBC 8.0 07/12/2021   HGB 15.2 07/12/2021   HCT 44.4 07/12/2021   PLT 235 07/12/2021   GLUCOSE 99 07/12/2021   CHOL 208 (H) 07/12/2021   TRIG 85 07/12/2021   HDL 50 07/12/2021   LDLCALC 143 (H) 07/12/2021   ALT 15 07/12/2021   AST 19 07/12/2021   NA 146 (H) 07/12/2021   K 4.2 07/12/2021   CL 106 07/12/2021   CREATININE 1.12 07/12/2021   BUN 15 07/12/2021   CO2 24 07/12/2021   HGBA1C 5.6 03/17/2020      Assessment & Plan:   Problem List Items Addressed This Visit       Nervous and Auditory   Meningioma [facility])    Reviewed pt record after departure.  We will reach out to he and his wife, as to whether they wish to pursue follow up or repeat mri of brain.        Other   Mixed hyperlipidemia    Not at goal.  Pt refused to take lipitor due to history of myalgias. Pt's wife is to call us back if willing to try an alternative.  Continue to work on eating a healthy diet and exercise.  Labs  drawn today.       Overweight with body mass index (BMI) of 29 to 29.9 in adult    Low fat diet.  Active lifestyle      Myalgia due to statin    Encouraged pt to consider alternative treatment.      Elevated glucose    Well controlled.   Continue to work on eating a healthy diet and exercise.  Labs drawn today.       Other Visit Diagnoses     Need for influenza vaccination    -  Primary   Relevant Orders   Flu Vaccine QUAD High Dose(Fluad) (Completed)     .  Orders Placed This Encounter  Procedures   Flu Vaccine QUAD High Dose(Fluad)     Follow-up: Return in about 3 months (around 10/11/2021) for chronic fasting.  An After Visit Summary was printed and given to the patient.  I,Lauren M Auman,acting as a scribe for Rochel Brome, MD.,have documented all relevant documentation on the behalf of Rochel Brome, MD,as directed by  Rochel Brome, MD while in the presence of Rochel Brome, MD.   Rochel Brome, MD [place] [phone]

## 2021-07-12 NOTE — Assessment & Plan Note (Signed)
Well controlled.   Continue to work on eating a healthy diet and exercise.  Labs drawn today.   

## 2021-07-12 NOTE — Assessment & Plan Note (Addendum)
Not at goal.  Pt refused to take lipitor due to history of myalgias. Pt's wife is to call us back if willing to try an alternative.  Continue to work on eating a healthy diet and exercise.  Labs drawn today.

## 2021-07-13 LAB — LIPID PANEL
Chol/HDL Ratio: 4.2 ratio (ref 0.0–5.0)
Cholesterol, Total: 208 mg/dL — ABNORMAL HIGH (ref 100–199)
HDL: 50 mg/dL (ref >39)
LDL Chol Calc (NIH): 143 mg/dL — ABNORMAL HIGH (ref 0–99)
Triglycerides: 85 mg/dL (ref 0–149)
VLDL Cholesterol Cal: 15 mg/dL (ref 5–40)

## 2021-07-13 LAB — COMPREHENSIVE METABOLIC PANEL
ALT: 15 IU/L (ref 0–44)
AST: 19 IU/L (ref 0–40)
Albumin/Globulin Ratio: 1.9 (ref 1.2–2.2)
Albumin: 4.4 g/dL (ref 3.6–4.6)
Alkaline Phosphatase: 102 IU/L (ref 44–121)
BUN/Creatinine Ratio: 13 (ref 10–24)
BUN: 15 mg/dL (ref 8–27)
Bilirubin Total: 0.5 mg/dL (ref 0.0–1.2)
CO2: 24 mmol/L (ref 20–29)
Calcium: 8.7 mg/dL (ref 8.6–10.2)
Chloride: 106 mmol/L (ref 96–106)
Creatinine, Ser: 1.12 mg/dL (ref 0.76–1.27)
Globulin, Total: 2.3 g/dL (ref 1.5–4.5)
Glucose: 99 mg/dL (ref 65–99)
Potassium: 4.2 mmol/L (ref 3.5–5.2)
Sodium: 146 mmol/L — ABNORMAL HIGH (ref 134–144)
Total Protein: 6.7 g/dL (ref 6.0–8.5)
eGFR: 64 mL/min/{1.73_m2} (ref >59)

## 2021-07-13 LAB — CBC WITH DIFFERENTIAL/PLATELET
Basophils Absolute: 0.1 10*3/uL (ref 0.0–0.2)
Basos: 1 %
EOS (ABSOLUTE): 0.6 10*3/uL — ABNORMAL HIGH (ref 0.0–0.4)
Eos: 8 %
Hematocrit: 44.4 % (ref 37.5–51.0)
Hemoglobin: 15.2 g/dL (ref 13.0–17.7)
Immature Grans (Abs): 0 10*3/uL (ref 0.0–0.1)
Immature Granulocytes: 0 %
Lymphocytes Absolute: 2.2 10*3/uL (ref 0.7–3.1)
Lymphs: 28 %
MCH: 29.6 pg (ref 26.6–33.0)
MCHC: 34.2 g/dL (ref 31.5–35.7)
MCV: 87 fL (ref 79–97)
Monocytes Absolute: 0.8 10*3/uL (ref 0.1–0.9)
Monocytes: 9 %
Neutrophils Absolute: 4.3 10*3/uL (ref 1.4–7.0)
Neutrophils: 54 %
Platelets: 235 10*3/uL (ref 150–450)
RBC: 5.13 x10E6/uL (ref 4.14–5.80)
RDW: 12.7 % (ref 11.6–15.4)
WBC: 8 10*3/uL (ref 3.4–10.8)

## 2021-07-13 LAB — CARDIOVASCULAR RISK ASSESSMENT

## 2021-07-23 ENCOUNTER — Encounter: Payer: Self-pay | Admitting: Family Medicine

## 2021-07-23 DIAGNOSIS — T466X5A Adverse effect of antihyperlipidemic and antiarteriosclerotic drugs, initial encounter: Secondary | ICD-10-CM | POA: Insufficient documentation

## 2021-07-23 DIAGNOSIS — M791 Myalgia, unspecified site: Secondary | ICD-10-CM | POA: Insufficient documentation

## 2021-07-23 NOTE — Assessment & Plan Note (Signed)
Low fat diet.  Active lifestyle

## 2021-07-23 NOTE — Assessment & Plan Note (Signed)
Encouraged pt to consider alternative treatment.

## 2021-07-23 NOTE — Assessment & Plan Note (Signed)
Reviewed pt record after departure.  We will reach out to he and his wife, as to whether they wish to pursue follow up or repeat mri of brain.

## 2022-01-11 ENCOUNTER — Ambulatory Visit (INDEPENDENT_AMBULATORY_CARE_PROVIDER_SITE_OTHER): Payer: Medicare HMO | Admitting: Family Medicine

## 2022-01-11 ENCOUNTER — Encounter: Payer: Self-pay | Admitting: Family Medicine

## 2022-01-11 ENCOUNTER — Other Ambulatory Visit: Payer: Self-pay

## 2022-01-11 VITALS — BP 136/70 | HR 84 | Temp 97.1°F | Resp 18 | Ht 68.5 in | Wt 199.0 lb

## 2022-01-11 DIAGNOSIS — M791 Myalgia, unspecified site: Secondary | ICD-10-CM | POA: Diagnosis not present

## 2022-01-11 DIAGNOSIS — Z6826 Body mass index (BMI) 26.0-26.9, adult: Secondary | ICD-10-CM | POA: Insufficient documentation

## 2022-01-11 DIAGNOSIS — Z6829 Body mass index (BMI) 29.0-29.9, adult: Secondary | ICD-10-CM | POA: Diagnosis not present

## 2022-01-11 DIAGNOSIS — D320 Benign neoplasm of cerebral meninges: Secondary | ICD-10-CM | POA: Diagnosis not present

## 2022-01-11 DIAGNOSIS — E782 Mixed hyperlipidemia: Secondary | ICD-10-CM | POA: Diagnosis not present

## 2022-01-11 DIAGNOSIS — T466X5A Adverse effect of antihyperlipidemic and antiarteriosclerotic drugs, initial encounter: Secondary | ICD-10-CM | POA: Diagnosis not present

## 2022-01-11 DIAGNOSIS — R7309 Other abnormal glucose: Secondary | ICD-10-CM | POA: Diagnosis not present

## 2022-01-11 NOTE — Assessment & Plan Note (Signed)
Check A1c.  Continue eating healthy and exercising. ?

## 2022-01-11 NOTE — Progress Notes (Addendum)
? ?Subjective:  ?Patient ID: Mark Watkins, male    DOB: 1931/01/27  Age: 88 y.o. MRN: [id number] ? ?Chief Complaint  ?Patient presents with  ? Hyperlipidemia  ? ?HPI ?Patient is an 88 year old white male with past medical history significant for hyperlipidemia, prediabetes, and an incidental cerebral meningioma who presents for chronic follow-up.  Patient denies any complaints.  Patient is taking Lipitor for his high cholesterol however did not tolerate this.  It caused muscle pain.  Patient eats fairly healthy.  He continues to work to deliver flowers.  Significant daily activity with this.  Patient saw Dr. Ronnald Ramp for the cerebral meningioma which is approximately 4 cm in diameter.  Dr. Ronnald Ramp at the time and recommended MRIs every 3 months.  The patient refuses the MRIs as he states he would not do any surgery or treatment for this anyways.  Patient refuses all vaccinations.  The meningioma was found due to the patient's having issues with memory loss.  Patient states he is fine.  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  He says it is all his wife who thinks he is got memory issues.. ?  ?Current Outpatient Medications on File Prior to Visit  ?Medication Sig Dispense Refill  ? Multiple Vitamin (MULTIVITAMIN ADULT PO) Take 1 tablet by mouth daily.    ? ?No current facility-administered medications on file prior to visit.  ? ?Past Medical History:  ?Diagnosis Date  ? Acute respiratory failure with hypoxia ([place])   ? Age-related cognitive decline   ? Benign neoplasm of cerebral meninges (HCC)   ? Elevated BP without diagnosis of hypertension   ? Multiple subsegmental pulmonary emboli without acute cor pulmonale (HCC)   ? Parageusia   ? Solitary pulmonary nodule   ? ?Past Surgical History:  ?Procedure Laterality Date  ? CATARACT EXTRACTION, BILATERAL    ? REPLACEMENT TOTAL KNEE BILATERAL    ?  ?Family History  ?Problem Relation Age of Onset  ? Alzheimer's disease Mother   ? Osteoarthritis Mother   ?  Heart murmur Mother   ? Heart attack Father   ? ?Social History  ? ?Socioeconomic History  ? Marital status: Married  ?  Spouse name: Shirlee Limerick  ? Number of children: 2  ? Years of education: Not on file  ? Highest education level: Not on file  ?Occupational History  ? Not on file  ?Tobacco Use  ? Smoking status: Former  ?  Types: Cigarettes  ?  Quit date: 70  ?  Years since quitting: 56.2  ? Smokeless tobacco: Never  ?Vaping Use  ? Vaping Use: Never used  ?Substance and Sexual Activity  ? Alcohol use: Never  ? Drug use: Never  ? Sexual activity: Not on file  ?Other Topics Concern  ? Not on file  ?Social History Narrative  ? 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 ?Social Determinants of Health  ? ?Financial Resource Strain: Not on file  ?Food Insecurity: No Food Insecurity  ? Worried About Charity fundraiser in the Last Year: Never true  ? Ran Out of Food in the Last Year: Never true  ?Transportation Needs: No Transportation Needs  ? Lack of Transportation (Medical): No  ? Lack of Transportation (Non-Medical): No  ?Physical Activity: Not on file  ?Stress: Not on file  ?Social Connections: Not on file  ? ? ?Review of Systems  ?Constitutional:  Negative for chills and fever.  ?HENT:  Negative for congestion, rhinorrhea and sore throat.   ?Respiratory:  Negative for cough and shortness of breath.   ?Cardiovascular:  Negative for chest pain and palpitations.  ?Gastrointestinal:  Negative for abdominal pain, constipation, diarrhea, nausea and vomiting.  ?Genitourinary:  Negative for dysuria and urgency.  ?Musculoskeletal:  Negative for arthralgias, back pain and myalgias.  ?Neurological:  Negative for dizziness and headaches.  ?Psychiatric/Behavioral:  Negative for dysphoric mood. The patient is not nervous/anxious.   ? ? ?Objective:  ?BP 136/70   Pulse 84   Temp (!) 97.1 ?F (36.2 ?C)   Resp 18   Ht 5' 8.5" (1.74 m)   Wt 199 lb (90.3 kg)   BMI 29.82 kg/m?   ? ?BP/Weight 01/11/2022 07/12/2021 06/15/2021  ?Systolic BP 812 751 700  ?Diastolic BP 70 60 60  ?Wt. (Lbs) 199 200.6 201.2  ?BMI 29.82 29.62 29.71  ? ?Physical Exam ?Vitals reviewed.  ?Constitutional:   ?   Appearance: Normal appearance. He is normal weight.  ?Neck:  ?   Vascular: No carotid bruit.  ?Cardiovascular:  ?   Rate and Rhythm: Normal rate and regular rhythm.  ?   Pulses: Normal pulses.  ?   Heart sounds: Normal heart sounds.  ?Pulmonary:  ?   Effort: Pulmonary effort is normal.  ?   Breath sounds: Normal breath sounds. No wheezing, rhonchi or rales.  ?Abdominal:  ?   General: Bowel sounds are normal.  ?   Palpations: Abdomen is soft.  ?   Tenderness: There is no abdominal tenderness.  ?Neurological:  ?   Mental Status: He is alert.  ?Psychiatric:     ?   Mood and Affect: Mood normal.     ?   Behavior: Behavior normal.  ? ? ?Diabetic Foot Exam - Simple   ?No data filed ?  ?  ? ?Lab Results  ?Component Value Date  ? WBC 8.0 07/12/2021  ? HGB 15.2 07/12/2021  ? HCT 44.4 07/12/2021  ? PLT 235 07/12/2021  ? GLUCOSE 99 07/12/2021  ? CHOL 208 (H) 07/12/2021  ? TRIG 85 07/12/2021  ? HDL 50 07/12/2021  ? LDLCALC 143 (H) 07/12/2021  ? ALT 15 07/12/2021  ? AST 19 07/12/2021  ? NA 146 (H) 07/12/2021  ? K 4.2 07/12/2021  ? CL 106 07/12/2021  ? CREATININE 1.12 07/12/2021  ? BUN 15 07/12/2021  ? CO2 24 07/12/2021  ? HGBA1C 5.6 03/17/2020  ? ? ? ? ?Assessment & Plan:  ? ?Problem List Items Addressed This Visit   ? ?  ? Nervous and Auditory  ? Meningioma, cerebral ([place])  ?  Patient refuses any treatment.  Refuses monitoring with MRIs. ?  ?  ?  ? Other  ? Mixed hyperlipidemia  ?  Check cholesterol.  Consider alternative statin. ?Low-fat diet.  Continue exercise. ?  ?  ? Relevant Orders  ? CBC with Differential/Platelet  ? Lipid panel  ? TSH  ? Comprehensive metabolic panel  ? Elevated glucose - Primary  ?  Check A1c.  Continue eating healthy and exercising. ?  ?  ? Relevant Orders  ? Hemoglobin A1c  ? Myalgia due to statin   ?  Intolerant to Lipitor. ?  ?  ? BMI 29.0-29.9,adult  ?  Continue eating healthy and exercising. ?  ?  ?. ? ?No orders of the defined types were placed in this encounter. ? ? ?Orders Placed This Encounter  ?Procedures  ? CBC with Differential/Platelet  ? Lipid panel  ?  Hemoglobin A1c  ? TSH  ? Comprehensive metabolic panel  ?  ? ?Follow-up: Return in about 6 months (around 07/14/2022) for awv with me. . ? ?An After Visit Summary was printed and given to the patient. ? ?Rochel Brome, MD ?[place] ?[phone] ?

## 2022-01-11 NOTE — Assessment & Plan Note (Signed)
Continue eating healthy and exercising 

## 2022-01-11 NOTE — Assessment & Plan Note (Signed)
Check cholesterol.  Consider alternative statin. ?Low-fat diet.  Continue exercise. ?

## 2022-01-11 NOTE — Patient Instructions (Signed)
Consider Shingles vaccine and tetanus vaccines at the pharmacy. ?

## 2022-01-11 NOTE — Assessment & Plan Note (Signed)
Patient refuses any treatment.  Refuses monitoring with MRIs. ?

## 2022-01-11 NOTE — Assessment & Plan Note (Signed)
Intolerant to Lipitor. 

## 2022-01-12 LAB — CBC WITH DIFFERENTIAL/PLATELET
Basophils Absolute: 0.1 10*3/uL (ref 0.0–0.2)
Basos: 1 %
EOS (ABSOLUTE): 0.4 10*3/uL (ref 0.0–0.4)
Eos: 5 %
Hematocrit: 42.3 % (ref 37.5–51.0)
Hemoglobin: 14.3 g/dL (ref 13.0–17.7)
Immature Grans (Abs): 0 10*3/uL (ref 0.0–0.1)
Immature Granulocytes: 0 %
Lymphocytes Absolute: 2.3 10*3/uL (ref 0.7–3.1)
Lymphs: 29 %
MCH: 29.4 pg (ref 26.6–33.0)
MCHC: 33.8 g/dL (ref 31.5–35.7)
MCV: 87 fL (ref 79–97)
Monocytes Absolute: 0.8 10*3/uL (ref 0.1–0.9)
Monocytes: 11 %
Neutrophils Absolute: 4.3 10*3/uL (ref 1.4–7.0)
Neutrophils: 54 %
Platelets: 246 10*3/uL (ref 150–450)
RBC: 4.87 x10E6/uL (ref 4.14–5.80)
RDW: 13 % (ref 11.6–15.4)
WBC: 7.9 10*3/uL (ref 3.4–10.8)

## 2022-01-12 LAB — LIPID PANEL
Chol/HDL Ratio: 3.6 ratio (ref 0.0–5.0)
Cholesterol, Total: 187 mg/dL (ref 100–199)
HDL: 52 mg/dL (ref >39)
LDL Chol Calc (NIH): 122 mg/dL — ABNORMAL HIGH (ref 0–99)
Triglycerides: 69 mg/dL (ref 0–149)
VLDL Cholesterol Cal: 13 mg/dL (ref 5–40)

## 2022-01-12 LAB — COMPREHENSIVE METABOLIC PANEL
ALT: 16 IU/L (ref 0–44)
AST: 18 IU/L (ref 0–40)
Albumin/Globulin Ratio: 1.9 (ref 1.2–2.2)
Albumin: 4.2 g/dL (ref 3.6–4.6)
Alkaline Phosphatase: 100 IU/L (ref 44–121)
BUN/Creatinine Ratio: 14 (ref 10–24)
BUN: 17 mg/dL (ref 8–27)
Bilirubin Total: 0.4 mg/dL (ref 0.0–1.2)
CO2: 27 mmol/L (ref 20–29)
Calcium: 8.9 mg/dL (ref 8.6–10.2)
Chloride: 102 mmol/L (ref 96–106)
Creatinine, Ser: 1.19 mg/dL (ref 0.76–1.27)
Globulin, Total: 2.2 g/dL (ref 1.5–4.5)
Glucose: 102 mg/dL — ABNORMAL HIGH (ref 70–99)
Potassium: 4.3 mmol/L (ref 3.5–5.2)
Sodium: 141 mmol/L (ref 134–144)
Total Protein: 6.4 g/dL (ref 6.0–8.5)
eGFR: 59 mL/min/{1.73_m2} — ABNORMAL LOW (ref >59)

## 2022-01-12 LAB — TSH: TSH: 3.47 u[IU]/mL (ref 0.450–4.500)

## 2022-01-12 LAB — CARDIOVASCULAR RISK ASSESSMENT

## 2022-01-12 LAB — HEMOGLOBIN A1C
Est. average glucose Bld gHb Est-mCnc: 126 mg/dL
Hgb A1c MFr Bld: 6 % — ABNORMAL HIGH (ref 4.8–5.6)

## 2022-05-09 ENCOUNTER — Other Ambulatory Visit: Payer: Self-pay | Admitting: Family Medicine

## 2022-05-09 MED ORDER — OLOPATADINE HCL 0.1 % OP SOLN: 1.0000 [drp] | Freq: Two times a day (BID) | OPHTHALMIC | 12 refills | Status: DC

## 2022-05-11 DIAGNOSIS — H31421 Serous choroidal detachment, right eye: Secondary | ICD-10-CM | POA: Diagnosis not present

## 2022-05-11 DIAGNOSIS — H35373 Puckering of macula, bilateral: Secondary | ICD-10-CM | POA: Diagnosis not present

## 2022-05-11 DIAGNOSIS — H3321 Serous retinal detachment, right eye: Secondary | ICD-10-CM | POA: Diagnosis not present

## 2022-05-11 DIAGNOSIS — H43813 Vitreous degeneration, bilateral: Secondary | ICD-10-CM | POA: Diagnosis not present

## 2022-05-23 DIAGNOSIS — H35373 Puckering of macula, bilateral: Secondary | ICD-10-CM | POA: Diagnosis not present

## 2022-05-23 DIAGNOSIS — H33001 Unspecified retinal detachment with retinal break, right eye: Secondary | ICD-10-CM | POA: Diagnosis not present

## 2022-05-23 DIAGNOSIS — Z961 Presence of intraocular lens: Secondary | ICD-10-CM | POA: Diagnosis not present

## 2022-05-23 DIAGNOSIS — H31401 Unspecified choroidal detachment, right eye: Secondary | ICD-10-CM | POA: Diagnosis not present

## 2022-05-23 DIAGNOSIS — H43813 Vitreous degeneration, bilateral: Secondary | ICD-10-CM | POA: Diagnosis not present

## 2022-05-31 DIAGNOSIS — Z961 Presence of intraocular lens: Secondary | ICD-10-CM | POA: Diagnosis not present

## 2022-05-31 DIAGNOSIS — H35373 Puckering of macula, bilateral: Secondary | ICD-10-CM | POA: Diagnosis not present

## 2022-05-31 DIAGNOSIS — H31401 Unspecified choroidal detachment, right eye: Secondary | ICD-10-CM | POA: Diagnosis not present

## 2022-05-31 DIAGNOSIS — H43813 Vitreous degeneration, bilateral: Secondary | ICD-10-CM | POA: Diagnosis not present

## 2022-05-31 DIAGNOSIS — H33001 Unspecified retinal detachment with retinal break, right eye: Secondary | ICD-10-CM | POA: Diagnosis not present

## 2022-06-11 DIAGNOSIS — H31401 Unspecified choroidal detachment, right eye: Secondary | ICD-10-CM | POA: Diagnosis not present

## 2022-06-11 DIAGNOSIS — H3521 Other non-diabetic proliferative retinopathy, right eye: Secondary | ICD-10-CM | POA: Diagnosis not present

## 2022-06-11 DIAGNOSIS — H33021 Retinal detachment with multiple breaks, right eye: Secondary | ICD-10-CM | POA: Diagnosis not present

## 2022-06-11 DIAGNOSIS — H3341 Traction detachment of retina, right eye: Secondary | ICD-10-CM | POA: Diagnosis not present

## 2022-06-11 DIAGNOSIS — H31411 Hemorrhagic choroidal detachment, right eye: Secondary | ICD-10-CM | POA: Diagnosis not present

## 2022-06-11 DIAGNOSIS — H33001 Unspecified retinal detachment with retinal break, right eye: Secondary | ICD-10-CM | POA: Diagnosis not present

## 2022-06-12 DIAGNOSIS — H43813 Vitreous degeneration, bilateral: Secondary | ICD-10-CM | POA: Diagnosis not present

## 2022-06-12 DIAGNOSIS — Z961 Presence of intraocular lens: Secondary | ICD-10-CM | POA: Diagnosis not present

## 2022-06-12 DIAGNOSIS — Z48810 Encounter for surgical aftercare following surgery on the sense organs: Secondary | ICD-10-CM | POA: Diagnosis not present

## 2022-06-12 DIAGNOSIS — H31401 Unspecified choroidal detachment, right eye: Secondary | ICD-10-CM | POA: Diagnosis not present

## 2022-06-12 DIAGNOSIS — H35373 Puckering of macula, bilateral: Secondary | ICD-10-CM | POA: Diagnosis not present

## 2022-06-12 DIAGNOSIS — H33001 Unspecified retinal detachment with retinal break, right eye: Secondary | ICD-10-CM | POA: Diagnosis not present

## 2022-06-19 DIAGNOSIS — H43813 Vitreous degeneration, bilateral: Secondary | ICD-10-CM | POA: Diagnosis not present

## 2022-06-19 DIAGNOSIS — H35373 Puckering of macula, bilateral: Secondary | ICD-10-CM | POA: Diagnosis not present

## 2022-06-19 DIAGNOSIS — Z48810 Encounter for surgical aftercare following surgery on the sense organs: Secondary | ICD-10-CM | POA: Diagnosis not present

## 2022-06-19 DIAGNOSIS — Z961 Presence of intraocular lens: Secondary | ICD-10-CM | POA: Diagnosis not present

## 2022-06-19 DIAGNOSIS — H33001 Unspecified retinal detachment with retinal break, right eye: Secondary | ICD-10-CM | POA: Diagnosis not present

## 2022-06-19 DIAGNOSIS — H31401 Unspecified choroidal detachment, right eye: Secondary | ICD-10-CM | POA: Diagnosis not present

## 2022-07-11 ENCOUNTER — Encounter: Payer: Self-pay | Admitting: Family Medicine

## 2022-07-11 ENCOUNTER — Ambulatory Visit (INDEPENDENT_AMBULATORY_CARE_PROVIDER_SITE_OTHER): Payer: Medicare HMO | Admitting: Family Medicine

## 2022-07-11 VITALS — BP 134/70 | HR 60 | Temp 96.9°F | Resp 16 | Ht 72.0 in | Wt 195.2 lb

## 2022-07-11 DIAGNOSIS — Z23 Encounter for immunization: Secondary | ICD-10-CM

## 2022-07-11 DIAGNOSIS — E782 Mixed hyperlipidemia: Secondary | ICD-10-CM | POA: Diagnosis not present

## 2022-07-11 DIAGNOSIS — R7303 Prediabetes: Secondary | ICD-10-CM

## 2022-07-11 DIAGNOSIS — Z0000 Encounter for general adult medical examination without abnormal findings: Secondary | ICD-10-CM | POA: Diagnosis not present

## 2022-07-11 DIAGNOSIS — M791 Myalgia, unspecified site: Secondary | ICD-10-CM | POA: Diagnosis not present

## 2022-07-11 DIAGNOSIS — T466X5A Adverse effect of antihyperlipidemic and antiarteriosclerotic drugs, initial encounter: Secondary | ICD-10-CM

## 2022-07-11 DIAGNOSIS — Z6826 Body mass index (BMI) 26.0-26.9, adult: Secondary | ICD-10-CM

## 2022-07-11 NOTE — Progress Notes (Signed)
Subjective:  Patient ID: Mark Watkins, male    DOB: 1930-12-23  Age: 88 y.o. MRN: [id number]  Chief Complaint  Patient presents with   Annual Exam    HPI  Well Adult Physical: Patient here for a comprehensive physical exam.The patient reports problems - discomfort right eye.   Do you take any herbs or supplements that were not prescribed by a doctor? no Are you taking calcium supplements? no Are you taking aspirin daily? no  Encounter for general adult medical examination without abnormal findings  Physical ("At Risk" items are starred): Patient's last physical exam was 1 year ago .  Patient wears a seat belt, has smoke detectors,  practices appropriate gun safety, and wears sunscreen with extended sun exposure. Dental Care: wears dentures.   Ophthalmology/Optometry: Annual visit.  Hearing loss: none Vision impairments: legally blind right eye.    [facility] Visit from 07/11/2022 in [place]  PHQ-2 Total Score 0               Social History   Socioeconomic History   Marital status: Married    Spouse name: Shirlee Limerick   Number of children: 2   Years of education: Not on file   Highest education level: Not on file  Occupational History   Not on file  Tobacco Use   Smoking status: Former    Types: Cigarettes    Quit date: 1967    Years since quitting: 56.7   Smokeless tobacco: Never  Vaping Use   Vaping Use: Never used  Substance and Sexual Activity   Alcohol use: Never   Drug use: Never   Sexual activity: Not on file  Other Topics Concern   Not on file  Social History Narrative   Works part time Monday, Tuesday, 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 of Health   Financial Resource Strain: Low Risk  (07/11/2022)   Overall Financial Resource Strain (CARDIA)    Difficulty of Paying Living Expenses: Not hard at all  Food Insecurity: No Food Insecurity (06/15/2021)   Hunger Vital Sign    Worried About  Running Out of Food in the Last Year: Never true    [place] in the Last Year: Never true  Transportation Needs: No Transportation Needs (06/15/2021)   PRAPARE - Hydrologist (Medical): No    Lack of Transportation (Non-Medical): No  Physical Activity: Insufficiently Active (07/11/2022)   Exercise Vital Sign    Days of Exercise per Week: 3 days    Minutes of Exercise per Session: 20 min  Stress: No Stress Concern Present (07/11/2022)   [place]    Feeling of Stress : Not at all  Social Connections: [place] (07/11/2022)   Social Connection and Isolation Panel [NHANES]    Frequency of Communication with Friends and Family: More than three times a week    Frequency of Social Gatherings with Friends and Family: More than three times a week    Attends Religious Services: More than 4 times per year    Active Member of [employer] or Organizations: Yes    Attends Music therapist: More than 4 times per year    Marital Status: Married   Past Medical History:  Diagnosis Date   Acute respiratory failure with hypoxia ([place])    Age-related cognitive decline    Benign neoplasm of cerebral meninges ([place])  Elevated BP without diagnosis of hypertension    Multiple subsegmental pulmonary emboli without acute cor pulmonale (HCC)    Par[place]usia    Solitary pulmonary nodule    Past Surgical History:  Procedure Laterality Date   CATARACT EXTRACTION, BILATERAL     REPLACEMENT TOTAL KNEE BILATERAL      Family History  Problem Relation Age of Onset   Alzheimer's disease Mother    Osteoarthritis Mother    Heart murmur Mother    Heart attack Father    Social History   Socioeconomic History   Marital status: Married    Spouse name: Shirlee Limerick   Number of children: 2   Years of education: Not on file   Highest education level: Not on file  Occupational History   Not on file   Tobacco Use   Smoking status: Former    Types: Cigarettes    Quit date: 1967    Years since quitting: 56.7   Smokeless tobacco: Never  Vaping Use   Vaping Use: Never used  Substance and Sexual Activity   Alcohol use: Never   Drug use: Never   Sexual activity: Not on file  Other Topics Concern   Not on file  Social History Narrative   Works part time Monday, Tuesday, 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[employer]   Social Determinants of Health   Financial Resource Strain: Low Risk  (07/11/2022)   Overall Financial Resource Strain (CARDIA)    Difficulty of Paying Living Expenses: Not hard at all  Food Insecurity: No Food Insecurity (06/15/2021)   Hunger Vital Sign    Worried About Running Out of Food in the Last Year: Never true    Ran Out of Food in the Last Year: Never true  Transportation Needs: No Transportation Needs (06/15/2021)   PRAPARE - Hydrologist (Medical): No    Lack of Transportation (Non-Medical): No  Physical Activity: Insufficiently Active (07/11/2022)   Exercise Vital Sign    Days of Exercise per Week: 3 days    Minutes of Exercise per Session: 20 min  Stress: No Stress Concern Present (07/11/2022)   [place]    Feeling of Stress : Not at all  Social Connections: [place] (07/11/2022)   Social Connection and Isolation Panel [NHANES]    Frequency of Communication with Friends and Family: More than three times a week    Frequency of Social Gatherings with Friends and Family: More than three times a week    Attends Religious Services: More than 4 times per year    Active Member of [employer] or Organizations: Yes    Attends Music therapist: More than 4 times per year    Marital Status: Married   Review of Systems  Constitutional:  Negative for chills and fever.  HENT:  Negative for congestion, rhinorrhea and sore throat.   Respiratory:   Negative for cough and shortness of breath.   Cardiovascular:  Negative for chest pain and palpitations.  Gastrointestinal:  Negative for abdominal pain, constipation, diarrhea, nausea and vomiting.  Genitourinary:  Negative for dysuria and urgency.  Musculoskeletal:  Negative for arthralgias, back pain and myalgias.  Neurological:  Negative for dizziness and headaches.  Psychiatric/Behavioral:  Negative for dysphoric mood. The patient is not nervous/anxious.      Objective:  BP 134/70   Pulse 60   Temp (!) 96.9 F (36.1 C)   Resp 16   Ht 6' (  1.829 m)   Wt 195 lb 3.2 oz (88.5 kg)   BMI 26.47 kg/m      07/11/2022    9:03 AM 01/11/2022    8:01 AM 07/12/2021   10:47 AM  BP/Weight  Systolic BP 726 203 559  Diastolic BP 70 70 60  Wt. (Lbs) 195.2 199 200.6  BMI 26.47 kg/m2 29.82 kg/m2 29.62 kg/m2    Physical Exam Vitals reviewed.  Constitutional:      Appearance: Normal appearance.  Neck:     Vascular: No carotid bruit.  Cardiovascular:     Rate and Rhythm: Normal rate and regular rhythm.     Pulses: Normal pulses.     Heart sounds: Normal heart sounds.  Pulmonary:     Effort: Pulmonary effort is normal.     Breath sounds: Normal breath sounds. No wheezing, rhonchi or rales.  Abdominal:     General: Bowel sounds are normal.     Palpations: Abdomen is soft.     Tenderness: There is no abdominal tenderness.  Skin:    Findings: Lesion (numerous SKs.) present.  Neurological:     Mental Status: He is alert.  Psychiatric:        Mood and Affect: Mood normal.        Behavior: Behavior normal.     Lab Results  Component Value Date   WBC 7.1 07/11/2022   HGB 14.4 07/11/2022   HCT 42.8 07/11/2022   PLT 239 07/11/2022   GLUCOSE 99 07/11/2022   CHOL 196 07/11/2022   TRIG 95 07/11/2022   HDL 57 07/11/2022   LDLCALC 122 (H) 07/11/2022   ALT 13 07/11/2022   AST 16 07/11/2022   NA 143 07/11/2022   K 4.4 07/11/2022   CL 106 07/11/2022   CREATININE 1.02 07/11/2022    BUN 13 07/11/2022   CO2 25 07/11/2022   TSH 3.470 01/11/2022   HGBA1C 5.9 (H) 07/11/2022      Assessment & Plan:   Problem List Items Addressed This Visit       Other   Mixed hyperlipidemia    Recommend continue to work on eating healthy diet and exercise. Check flp. Intolerant to statins      Relevant Orders   Comprehensive metabolic panel (Completed)   CBC with Differential/Platelet (Completed)   Lipid panel (Completed)   Myalgia due to statin    Intolerant to statins      BMI 26.0-26.9,adult   General medical exam - Primary    Things to do to keep yourself healthy  - Exercise at least 30-45 minutes a day, 3-4 days a week.  - Eat a low-fat diet with lots of fruits and vegetables, up to 7-9 servings per day.  - Seatbelts can save your life. Wear them always.  - Smoke detectors on every level of your home, check batteries every year.  - Eye Doctor - have an eye exam every 1-2 years  - Alcohol -  If you drink, do it moderately, less than 2 drinks per day.  - [place]. Choose someone to speak for you if you are not able. Please bring [place] a copy.  - Depression is common in our stressful world.If you're feeling down or losing interest in things you normally enjoy, please come in for a visit.       Relevant Orders   Hemoglobin A1c (Completed)   Prediabetes    Recommend continue to work on eating healthy diet and exercise.  Need for influenza vaccination   Relevant Orders   Flu Vaccine QUAD High Dose(Fluad) (Completed)    Body mass index is 26.47 kg/m.   THis is a list of the screening recommended for you and due dates:  Health Maintenance  Topic Date Due   Zoster (Shingles) Vaccine (1 of 2) Never done   COVID-19 Vaccine (2 - Booster for Janssen series) 03/21/2020   Tetanus Vaccine  01/12/2023*   Pneumonia Vaccine  Completed   Flu Shot  Completed   HPV Vaccine  Aged Out  *Topic was postponed. The date shown is not the original due  date.     No orders of the defined types were placed in this encounter.    Follow-up: Return in about 6 months (around 01/09/2023) for chronic fasting.  An After Visit Summary was printed and given to the patient.  Rochel Brome, MD [facility] [phone]

## 2022-07-11 NOTE — Assessment & Plan Note (Signed)
Intolerant to statins. 

## 2022-07-11 NOTE — Assessment & Plan Note (Addendum)
Recommend continue to work on eating healthy diet and exercise. Check flp. Intolerant to statins

## 2022-07-11 NOTE — Patient Instructions (Signed)
Things to do to keep yourself healthy  - Exercise at least 30-45 minutes a day, 3-4 days a week.  - Eat a low-fat diet with lots of fruits and vegetables, up to 7-9 servings per day.  - Seatbelts can save your life. Wear them always.  - Smoke detectors on every level of your home, check batteries every year.  - Eye Doctor - have an eye exam every 1-2 years  - Alcohol -  If you drink, do it moderately, less than 2 drinks per day.  - Health Care Power of Attorney. Choose someone to speak for you if you are not able. Please bring us a copy.  - Depression is common in our stressful world.If you're feeling down or losing interest in things you normally enjoy, please come in for a visit.  

## 2022-07-12 LAB — CBC WITH DIFFERENTIAL/PLATELET
Basophils Absolute: 0.1 10*3/uL (ref 0.0–0.2)
Basos: 1 %
EOS (ABSOLUTE): 0.4 10*3/uL (ref 0.0–0.4)
Eos: 5 %
Hematocrit: 42.8 % (ref 37.5–51.0)
Hemoglobin: 14.4 g/dL (ref 13.0–17.7)
Immature Grans (Abs): 0 10*3/uL (ref 0.0–0.1)
Immature Granulocytes: 0 %
Lymphocytes Absolute: 1.8 10*3/uL (ref 0.7–3.1)
Lymphs: 26 %
MCH: 29.5 pg (ref 26.6–33.0)
MCHC: 33.6 g/dL (ref 31.5–35.7)
MCV: 88 fL (ref 79–97)
Monocytes Absolute: 0.7 10*3/uL (ref 0.1–0.9)
Monocytes: 10 %
Neutrophils Absolute: 4.1 10*3/uL (ref 1.4–7.0)
Neutrophils: 58 %
Platelets: 239 10*3/uL (ref 150–450)
RBC: 4.88 x10E6/uL (ref 4.14–5.80)
RDW: 13.2 % (ref 11.6–15.4)
WBC: 7.1 10*3/uL (ref 3.4–10.8)

## 2022-07-12 LAB — LIPID PANEL
Chol/HDL Ratio: 3.4 ratio (ref 0.0–5.0)
Cholesterol, Total: 196 mg/dL (ref 100–199)
HDL: 57 mg/dL (ref >39)
LDL Chol Calc (NIH): 122 mg/dL — ABNORMAL HIGH (ref 0–99)
Triglycerides: 95 mg/dL (ref 0–149)
VLDL Cholesterol Cal: 17 mg/dL (ref 5–40)

## 2022-07-12 LAB — COMPREHENSIVE METABOLIC PANEL
ALT: 13 IU/L (ref 0–44)
AST: 16 IU/L (ref 0–40)
Albumin/Globulin Ratio: 1.7 (ref 1.2–2.2)
Albumin: 4.1 g/dL (ref 3.7–4.7)
Alkaline Phosphatase: 105 IU/L (ref 44–121)
BUN/Creatinine Ratio: 13 (ref 10–24)
BUN: 13 mg/dL (ref 8–27)
Bilirubin Total: 0.5 mg/dL (ref 0.0–1.2)
CO2: 25 mmol/L (ref 20–29)
Calcium: 9.2 mg/dL (ref 8.6–10.2)
Chloride: 106 mmol/L (ref 96–106)
Creatinine, Ser: 1.02 mg/dL (ref 0.76–1.27)
Globulin, Total: 2.4 g/dL (ref 1.5–4.5)
Glucose: 99 mg/dL (ref 70–99)
Potassium: 4.4 mmol/L (ref 3.5–5.2)
Sodium: 143 mmol/L (ref 134–144)
Total Protein: 6.5 g/dL (ref 6.0–8.5)
eGFR: 71 mL/min/{1.73_m2} (ref >59)

## 2022-07-12 LAB — CARDIOVASCULAR RISK ASSESSMENT

## 2022-07-12 LAB — HEMOGLOBIN A1C
Est. average glucose Bld gHb Est-mCnc: 123 mg/dL
Hgb A1c MFr Bld: 5.9 % — ABNORMAL HIGH (ref 4.8–5.6)

## 2022-07-21 DIAGNOSIS — Z0000 Encounter for general adult medical examination without abnormal findings: Secondary | ICD-10-CM | POA: Insufficient documentation

## 2022-07-21 DIAGNOSIS — Z23 Encounter for immunization: Secondary | ICD-10-CM | POA: Insufficient documentation

## 2022-07-21 DIAGNOSIS — R7303 Prediabetes: Secondary | ICD-10-CM | POA: Insufficient documentation

## 2022-07-21 NOTE — Assessment & Plan Note (Signed)
Things to do to keep yourself healthy  - Exercise at least 30-45 minutes a day, 3-4 days a week.  - Eat a low-fat diet with lots of fruits and vegetables, up to 7-9 servings per day.  - Seatbelts can save your life. Wear them always.  - Smoke detectors on every level of your home, check batteries every year.  - Eye Doctor - have an eye exam every 1-2 years  - Alcohol -  If you drink, do it moderately, less than 2 drinks per day.  - Health Care Power of Attorney. Choose someone to speak for you if you are not able. Please bring us a copy.  - Depression is common in our stressful world.If you're feeling down or losing interest in things you normally enjoy, please come in for a visit.  

## 2022-07-21 NOTE — Assessment & Plan Note (Signed)
Recommend continue to work on eating healthy diet and exercise.  

## 2022-08-03 ENCOUNTER — Telehealth (INDEPENDENT_AMBULATORY_CARE_PROVIDER_SITE_OTHER): Payer: Medicare HMO | Admitting: Family Medicine

## 2022-08-03 DIAGNOSIS — J069 Acute upper respiratory infection, unspecified: Secondary | ICD-10-CM

## 2022-08-03 DIAGNOSIS — U071 COVID-19: Secondary | ICD-10-CM | POA: Diagnosis not present

## 2022-08-03 MED ORDER — NIRMATRELVIR/RITONAVIR (PAXLOVID)TABLET: 3.0000 | ORAL_TABLET | Freq: Two times a day (BID) | ORAL | 0 refills | Status: AC

## 2022-08-03 NOTE — Progress Notes (Signed)
Virtual Visit via Video Note   This visit type was conducted due to national recommendations for restrictions regarding the COVID-19 Pandemic (e.g. social distancing) in an effort to limit this patient's exposure and mitigate transmission in our community.  Due to his co-morbid illnesses, this patient is at least at moderate risk for complications without adequate follow up.  This format is felt to be most appropriate for this patient at this time.  All issues noted in this document were discussed and addressed.  A limited physical exam was performed with this format.  A verbal consent was obtained for the virtual visit.   Date:  08/05/2022   ID:  Mark Watkins, DOB [birth year], MRN [id number]  Patient Location: Home Provider Location: Office/Clinic  PCP:  Rochel Brome, MD   Evaluation Performed:  acute  Chief Complaint:  hoarseness, fatigue  History of Present Illness:    Mark Watkins is a 88 y.o. male with hoarseness, fatigue. No fever, chills, sweats. Patient's wife tested positive for coivd 17 yesterday. He does not have a test at home. Pulse ox 93%  The patient does have symptoms concerning for COVID-19 infection (fever, chills, cough, or new shortness of breath).    Past Medical History:  Diagnosis Date   Acute respiratory failure with hypoxia (HCC)    Age-related cognitive decline    Benign neoplasm of cerebral meninges (HCC)    Elevated BP without diagnosis of hypertension    Multiple subsegmental pulmonary emboli without acute cor pulmonale (HCC)    Parageusia    Solitary pulmonary nodule     Past Surgical History:  Procedure Laterality Date   CATARACT EXTRACTION, BILATERAL     REPLACEMENT TOTAL KNEE BILATERAL      Family History  Problem Relation Age of Onset   Alzheimer's disease Mother    Osteoarthritis Mother    Heart murmur Mother    Heart attack Father     Social History   Socioeconomic History   Marital status: Married    Spouse name:  Mark Watkins   Number of children: 2   Years of education: Not on file   Highest education level: Not on file  Occupational History   Not on file  Tobacco Use   Smoking status: Former    Types: Cigarettes    Quit date: 1967    Years since quitting: 56.8   Smokeless tobacco: Never  Vaping Use   Vaping Use: Never used  Substance and Sexual Activity   Alcohol use: Never   Drug use: Never   Sexual activity: Not on file  Other Topics Concern   Not on file  Social History Narrative   Works part time Monday, Tuesday, 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[employer]   Social Determinants of Health   Financial Resource Strain: Low Risk  (07/11/2022)   Overall Financial Resource Strain (CARDIA)    Difficulty of Paying Living Expenses: Not hard at all  Food Insecurity: No Food Insecurity (06/15/2021)   Hunger Vital Sign    Worried About Running Out of Food in the Last Year: Never true    Ran Out of Food in the Last Year: Never true  Transportation Needs: No Transportation Needs (06/15/2021)   PRAPARE - Hydrologist (Medical): No    Lack of Transportation (Non-Medical): No  Physical Activity: Insufficiently Active (07/11/2022)   Exercise Vital Sign    Days of Exercise per Week: 3 days    Minutes of Exercise per  Session: 20 min  Stress: No Stress Concern Present (07/11/2022)   [place]    Feeling of Stress : Not at all  Social Connections: [place] (07/11/2022)   Social Connection and Isolation Panel [NHANES]    Frequency of Communication with Friends and Family: More than three times a week    Frequency of Social Gatherings with Friends and Family: More than three times a week    Attends Religious Services: More than 4 times per year    Active Member of [employer] or Organizations: Yes    Attends Music therapist: More than 4 times per year    Marital Status: Married   Human resources officer Violence: Not At Risk (06/15/2021)   Humiliation, Afraid, Rape, and Kick questionnaire    Fear of Current or Ex-Partner: No    Emotionally Abused: No    Physically Abused: No    Sexually Abused: No    Outpatient Medications Prior to Visit  Medication Sig Dispense Refill   Multiple Vitamin (MULTIVITAMIN ADULT PO) Take 1 tablet by mouth daily.     No facility-administered medications prior to visit.    Allergies:   Lipitor [atorvastatin]   Social History   Tobacco Use   Smoking status: Former    Types: Cigarettes    Quit date: 1967    Years since quitting: 56.8   Smokeless tobacco: Never  Vaping Use   Vaping Use: Never used  Substance Use Topics   Alcohol use: Never   Drug use: Never     Review of Systems  Constitutional:  Positive for malaise/fatigue. Negative for chills and fever.  HENT:  Negative for congestion, sinus pain and sore throat.        Hoarseness  Respiratory:  Negative for cough and shortness of breath.   Cardiovascular:  Negative for chest pain.     Labs/Other Tests and Data Reviewed:    Recent Labs: 01/11/2022: TSH 3.470 07/11/2022: ALT 13; BUN 13; Creatinine, Ser 1.02; Hemoglobin 14.4; Platelets 239; Potassium 4.4; Sodium 143   Recent Lipid Panel Lab Results  Component Value Date/Time   CHOL 196 07/11/2022 09:58 AM   TRIG 95 07/11/2022 09:58 AM   HDL 57 07/11/2022 09:58 AM   CHOLHDL 3.4 07/11/2022 09:58 AM   LDLCALC 122 (H) 07/11/2022 09:58 AM    Wt Readings from Last 3 Encounters:  07/11/22 195 lb 3.2 oz (88.5 kg)  01/11/22 199 lb (90.3 kg)  07/12/21 200 lb 9.6 oz (91 kg)     Objective:    Vital Signs:  There were no vitals taken for this visit.   Physical Exam   ASSESSMENT & PLAN:    Problem List Items Addressed This Visit       Respiratory   Upper respiratory tract infection due to COVID-19 virus - Primary    COVID-19 home recommendations: Recommend plenty of rest. Recommend drink plenty of  fluids. Recommend eat 3 meals a day. Recommend over-the-counter fever medications. Recommend over-the-counter cough and congestion medications. Recommend isolating for 5 days from the time of onset of symptoms, as well as resolution of fever and the majority of other COVID-19 symptoms. Then wear a mask for 5 days.   Rx: paxlovid sent.       Relevant Medications   nirmatrelvir/ritonavir EUA (PAXLOVID) 20 x 150 MG & 10 x '100MG'$  TABS  .  No orders of the defined types were placed in this encounter.    Meds ordered this  encounter  Medications   nirmatrelvir/ritonavir EUA (PAXLOVID) 20 x 150 MG & 10 x '100MG'$  TABS    Sig: Take 3 tablets by mouth 2 (two) times daily for 5 days. (Take nirmatrelvir 150 mg two tablets twice daily for 5 days and ritonavir 100 mg one tablet twice daily for 5 days) Patient GFR is 71    Dispense:  30 tablet    Refill:  0    COVID-19 Education: The signs and symptoms of COVID-19 were discussed with the patient and how to seek care for testing (follow up with PCP or arrange E-visit). The importance of social distancing was discussed today.   I spent 6 minutes dedicated to the care of this patient on the date of this encounter to include face-to-face time with the patient.  Follow Up:  In Person prn  Signed, Rochel Brome, MD  08/05/2022 11:21 PM    [place]

## 2022-08-05 ENCOUNTER — Encounter: Payer: Self-pay | Admitting: Family Medicine

## 2022-08-05 DIAGNOSIS — U071 COVID-19: Secondary | ICD-10-CM | POA: Insufficient documentation

## 2022-08-05 DIAGNOSIS — J069 Acute upper respiratory infection, unspecified: Secondary | ICD-10-CM | POA: Insufficient documentation

## 2022-08-05 NOTE — Assessment & Plan Note (Signed)
COVID-19 home recommendations: Recommend plenty of rest. Recommend drink plenty of fluids. Recommend eat 3 meals a day. Recommend over-the-counter fever medications. Recommend over-the-counter cough and congestion medications. Recommend isolating for 5 days from the time of onset of symptoms, as well as resolution of fever and the majority of other COVID-19 symptoms. Then wear a mask for 5 days.   Rx: paxlovid sent.

## 2022-11-08 DIAGNOSIS — H33001 Unspecified retinal detachment with retinal break, right eye: Secondary | ICD-10-CM | POA: Diagnosis not present

## 2022-11-08 DIAGNOSIS — Z961 Presence of intraocular lens: Secondary | ICD-10-CM | POA: Diagnosis not present

## 2022-11-08 DIAGNOSIS — H31401 Unspecified choroidal detachment, right eye: Secondary | ICD-10-CM | POA: Diagnosis not present

## 2022-11-08 DIAGNOSIS — H43813 Vitreous degeneration, bilateral: Secondary | ICD-10-CM | POA: Diagnosis not present

## 2022-11-08 DIAGNOSIS — H35373 Puckering of macula, bilateral: Secondary | ICD-10-CM | POA: Diagnosis not present

## 2023-03-14 DIAGNOSIS — Z961 Presence of intraocular lens: Secondary | ICD-10-CM | POA: Diagnosis not present

## 2023-03-14 DIAGNOSIS — H35373 Puckering of macula, bilateral: Secondary | ICD-10-CM | POA: Diagnosis not present

## 2023-03-14 DIAGNOSIS — H43813 Vitreous degeneration, bilateral: Secondary | ICD-10-CM | POA: Diagnosis not present

## 2023-03-14 DIAGNOSIS — H33001 Unspecified retinal detachment with retinal break, right eye: Secondary | ICD-10-CM | POA: Diagnosis not present

## 2023-06-21 DIAGNOSIS — R21 Rash and other nonspecific skin eruption: Secondary | ICD-10-CM | POA: Diagnosis not present

## 2023-06-21 DIAGNOSIS — L255 Unspecified contact dermatitis due to plants, except food: Secondary | ICD-10-CM | POA: Diagnosis not present

## 2023-07-08 DIAGNOSIS — H524 Presbyopia: Secondary | ICD-10-CM | POA: Diagnosis not present

## 2023-07-08 DIAGNOSIS — H16223 Keratoconjunctivitis sicca, not specified as Sjogren's, bilateral: Secondary | ICD-10-CM | POA: Diagnosis not present

## 2023-07-08 DIAGNOSIS — H33001 Unspecified retinal detachment with retinal break, right eye: Secondary | ICD-10-CM | POA: Diagnosis not present

## 2023-07-08 DIAGNOSIS — H59811 Chorioretinal scars after surgery for detachment, right eye: Secondary | ICD-10-CM | POA: Diagnosis not present

## 2023-07-08 DIAGNOSIS — H353131 Nonexudative age-related macular degeneration, bilateral, early dry stage: Secondary | ICD-10-CM | POA: Diagnosis not present

## 2023-07-11 ENCOUNTER — Ambulatory Visit (INDEPENDENT_AMBULATORY_CARE_PROVIDER_SITE_OTHER): Payer: Medicare HMO

## 2023-07-11 VITALS — Ht 72.0 in | Wt 190.0 lb

## 2023-07-11 DIAGNOSIS — Z0000 Encounter for general adult medical examination without abnormal findings: Secondary | ICD-10-CM | POA: Diagnosis not present

## 2023-07-11 NOTE — Patient Instructions (Addendum)
Mark Watkins , Thank you for taking time to come for your Medicare Wellness Visit. I appreciate your ongoing commitment to your health goals. Please review the following plan we discussed and let me know if I can assist you in the future.   Referrals/Orders/Follow-Ups/Clinician Recommendations:   This is a list of the screening recommended for you and due dates:  Health Maintenance  Topic Date Due   DTaP/Tdap/Td vaccine (1 - Tdap) Never done   Zoster (Shingles) Vaccine (1 of 2) Never done   Flu Shot  05/30/2023   COVID-19 Vaccine (2 - 2023-24 season) 06/30/2023   Medicare Annual Wellness Visit  07/10/2024   Pneumonia Vaccine  Completed   HPV Vaccine  Aged Out    Advanced directives: (Copy Requested) Please bring a copy of your health care power of attorney and living will to the office to be added to your chart at your convenience.  Next Medicare Annual Wellness Visit scheduled for next year: Yes

## 2023-07-11 NOTE — Progress Notes (Signed)
Subjective:   Mark Watkins is a 89 y.o. male who presents for Medicare Annual/Subsequent preventive examination.  Visit Complete: Virtual  I connected with  Mark Watkins on 07/11/23 by a audio enabled telemedicine application and verified that I am speaking with the correct person using two identifiers.  Patient Location: Home  Provider Location: Home Office  I discussed the limitations of evaluation and management by telemedicine. The patient expressed understanding and agreed to proceed.     Review of Systems    Vital Signs: Unable to obtain new vitals due to this being a telehealth visit.  Cardiac Risk Factors include: advanced age (>27men, >55 women);male gender     Objective:    Today's Vitals   07/11/23 1434  Weight: 190 lb (86.2 kg)  Height: 6' (1.829 m)   Body mass index is 25.77 kg/m.     07/11/2023    2:42 PM 07/11/2022    9:55 AM 06/15/2021   10:32 AM  Advanced Directives  Does Patient Have a Medical Advance Directive? Yes Yes Yes  Type of Estate agent of [place];Living will  Healthcare Power of [place];Living will  Does patient want to make changes to medical advance directive?  Yes (ED - Information included in AVS) No - Patient declined  Copy of Healthcare Power of Attorney in Chart? No - copy requested  No - copy requested    Current Medications (verified) Outpatient Encounter Medications as of 07/11/2023  Medication Sig   Multiple Vitamin (MULTIVITAMIN ADULT PO) Take 1 tablet by mouth daily.   No facility-administered encounter medications on file as of 07/11/2023.    Allergies (verified) Lipitor [atorvastatin]   History: Past Medical History:  Diagnosis Date   Acute respiratory failure with hypoxia (HCC)    Age-related cognitive decline    Benign neoplasm of cerebral meninges (HCC)    Elevated BP without diagnosis of hypertension    Multiple subsegmental pulmonary emboli without acute cor pulmonale (HCC)     Parageusia    Solitary pulmonary nodule    Past Surgical History:  Procedure Laterality Date   CATARACT EXTRACTION, BILATERAL     REPLACEMENT TOTAL KNEE BILATERAL     Family History  Problem Relation Age of Onset   Alzheimer's disease Mother    Osteoarthritis Mother    Heart murmur Mother    Heart attack Father    Social History   Socioeconomic History   Marital status: Married    Spouse name: [name]   Number of children: 2   Years of education: Not on file   Highest education level: Not on file  Occupational History   Not on file  Tobacco Use   Smoking status: Former    Current packs/day: 0.00    Types: Cigarettes    Quit date: 1967    Years since quitting: 57.7   Smokeless tobacco: Never  Vaping Use   Vaping status: Never Used  Substance and Sexual Activity   Alcohol use: Never   Drug use: Never   Sexual activity: Not on file  Other Topics Concern   Not on file  Social History Narrative   Works part time Monday, Tuesday, 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[employer]   Social Determinants of Health   Financial Resource Strain: Low Risk  (07/11/2023)   Overall Financial Resource Strain (CARDIA)    Difficulty of Paying Living Expenses: Not hard at all  Food Insecurity: No Food Insecurity (07/11/2023)   Hunger Vital Sign  Worried About Programme researcher, broadcasting/film/video in the Last Year: Never true    Ran Out of Food in the Last Year: Never true  Transportation Needs: No Transportation Needs (07/11/2023)   PRAPARE - Administrator, Civil Service (Medical): No    Lack of Transportation (Non-Medical): No  Physical Activity: Inactive (07/11/2023)   Exercise Vital Sign    Days of Exercise per Week: 0 days    Minutes of Exercise per Session: 0 min  Stress: No Stress Concern Present (07/11/2023)   [employer] of Occupational Health - Occupational Stress Questionnaire    Feeling of Stress : Not at all  Social Connections: Socially Integrated (07/11/2023)    Social Connection and Isolation Panel [NHANES]    Frequency of Communication with Friends and Family: More than three times a week    Frequency of Social Gatherings with Friends and Family: More than three times a week    Attends Religious Services: More than 4 times per year    Active Member of [employer] or Organizations: Yes    Attends Engineer, structural: More than 4 times per year    Marital Status: Married    Tobacco Counseling Counseling given: Not Answered   Clinical Intake:  Pre-visit preparation completed: Yes  Pain : No/denies pain     BMI - recorded: 25.77 Nutritional Status: BMI 25 -29 Overweight Nutritional Risks: None Diabetes: No  How often do you need to have someone help you when you read instructions, pamphlets, or other written materials from your doctor or pharmacy?: 1 - Never  Interpreter Needed?: No  Information entered by :: Theresa Mulligan LPN   Activities of Daily Living    07/11/2023    2:40 PM  In your present state of health, do you have any difficulty performing the following activities:  Hearing? 0  Vision? 0  Difficulty concentrating or making decisions? 0  Walking or climbing stairs? 0  Dressing or bathing? 0  Doing errands, shopping? 0  Preparing Food and eating ? N  Using the Toilet? N  In the past six months, have you accidently leaked urine? N  Do you have problems with loss of bowel control? N  Managing your Medications? N  Managing your Finances? N  Housekeeping or managing your Housekeeping? N    Patient Care Team: CoxFritzi Mandes, MD as PCP - General (Family Medicine) Albin Felling, OD (Optometry)  Indicate any recent Medical Services you may have received from other than [facility] providers in the past year (date may be approximate).     Assessment:   This is a routine wellness examination for Mark Watkins.  Hearing/Vision screen Hearing Screening - Comments:: Denies hearing difficulties   Vision Screening - Comments::  Wears rx glasses - up to date with routine eye exams with  Dr Precious Bard   Goals Addressed               This Visit's Progress     Live to be 89 yrs old (pt-stated)         Depression Screen    07/11/2023    2:40 PM 07/11/2022    9:07 AM 01/11/2022    8:04 AM 07/12/2021   10:49 AM 06/15/2021   10:30 AM 03/17/2020   11:02 AM  PHQ 2/9 Scores  PHQ - 2 Score 0 0 0 0 0 0    Fall Risk    07/11/2023    2:41 PM 07/11/2022    9:07 AM 01/11/2022  8:04 AM 07/12/2021   10:49 AM 06/15/2021   10:39 AM  Fall Risk   Falls in the past year? 0 0 0 0 0  Number falls in past yr: 0 0 0 0 0  Injury with Fall? 0 0 0 0 0  Risk for fall due to : No Fall Risks No Fall Risks   No Fall Risks  Follow up Falls prevention discussed Falls evaluation completed Falls evaluation completed Falls evaluation completed Falls evaluation completed    MEDICARE RISK AT HOME: Medicare Risk at Home Any stairs in or around the home?: No If so, are there any without handrails?: No Home free of loose throw rugs in walkways, pet beds, electrical cords, etc?: Yes Adequate lighting in your home to reduce risk of falls?: Yes Life alert?: No Use of a cane, walker or w/c?: No Grab bars in the bathroom?: No Shower chair or bench in shower?: Yes Elevated toilet seat or a handicapped toilet?: No  TIMED UP AND GO:  Was the test performed?  No    Cognitive Function:        07/11/2023    2:42 PM 06/15/2021   10:41 AM  6CIT Screen  What Year? 0 points 0 points  What month? 0 points 0 points  What time? 0 points 0 points  Count back from 20 0 points 0 points  Months in reverse 0 points 0 points  Repeat phrase 6 points 0 points  Total Score 6 points 0 points    Immunizations Immunization History  Administered Date(s) Administered   Fluad Quad(high Dose 65+) 07/27/2020, 07/12/2021, 07/11/2022   Janssen (J&J) SARS-COV-2 Vaccination 01/25/2020   Pneumococcal Conjugate-13 11/13/2017   Pneumococcal Polysaccharide-23  11/20/2018    TDAP status: Due, Education has been provided regarding the importance of this vaccine. Advised may receive this vaccine at local pharmacy or Health Dept. Aware to provide a copy of the vaccination record if obtained from local pharmacy or Health Dept. Verbalized acceptance and understanding.  Flu Vaccine status: Due, Education has been provided regarding the importance of this vaccine. Advised may receive this vaccine at local pharmacy or Health Dept. Aware to provide a copy of the vaccination record if obtained from local pharmacy or Health Dept. Verbalized acceptance and understanding.  Pneumococcal vaccine status: Up to date  Covid-19 vaccine status: Declined, Education has been provided regarding the importance of this vaccine but patient still declined. Advised may receive this vaccine at local pharmacy or Health Dept.or vaccine clinic. Aware to provide a copy of the vaccination record if obtained from local pharmacy or Health Dept. Verbalized acceptance and understanding.  Qualifies for Shingles Vaccine? Yes   Zostavax completed No   Shingrix Completed?: No.    Education has been provided regarding the importance of this vaccine. Patient has been advised to call insurance company to determine out of pocket expense if they have not yet received this vaccine. Advised may also receive vaccine at local pharmacy or Health Dept. Verbalized acceptance and understanding.  Screening Tests Health Maintenance  Topic Date Due   DTaP/Tdap/Td (1 - Tdap) Never done   Zoster Vaccines- Shingrix (1 of 2) Never done   INFLUENZA VACCINE  05/30/2023   COVID-19 Vaccine (2 - 2023-24 season) 06/30/2023   Medicare Annual Wellness (AWV)  07/10/2024   Pneumonia Vaccine 48+ Years old  Completed   HPV VACCINES  Aged Out    Health Maintenance  Health Maintenance Due  Topic Date Due   DTaP/Tdap/Td (1 - Tdap)  Never done   Zoster Vaccines- Shingrix (1 of 2) Never done   INFLUENZA VACCINE   05/30/2023   COVID-19 Vaccine (2 - 2023-24 season) 06/30/2023        Additional Screening:    Vision Screening: Recommended annual ophthalmology exams for early detection of glaucoma and other disorders of the eye. Is the patient up to date with their annual eye exam?  Yes  Who is the provider or what is the name of the office in which the patient attends annual eye exams? Dr Precious Bard If pt is not established with a provider, would they like to be referred to a provider to establish care? No .   Dental Screening: Recommended annual dental exams for proper oral hygiene   Community Resource Referral / Chronic Care Management:  CRR required this visit?  No   CCM required this visit?  No     Plan:     I have personally reviewed and noted the following in the patient's chart:   Medical and social history Use of alcohol, tobacco or illicit drugs  Current medications and supplements including opioid prescriptions. Patient is not currently taking opioid prescriptions. Functional ability and status Nutritional status Physical activity Advanced directives List of other physicians Hospitalizations, surgeries, and ER visits in previous 12 months Vitals Screenings to include cognitive, depression, and falls Referrals and appointments  In addition, I have reviewed and discussed with patient certain preventive protocols, quality metrics, and best practice recommendations. A written personalized care plan for preventive services as well as general preventive health recommendations were provided to patient.     Tillie Rung, LPN   03/30/931   After Visit Summary: (MyChart) Due to this being a telephonic visit, the after visit summary with patients personalized plan was offered to patient via MyChart   Nurse Notes: None

## 2023-08-15 DIAGNOSIS — H33001 Unspecified retinal detachment with retinal break, right eye: Secondary | ICD-10-CM | POA: Diagnosis not present

## 2023-08-15 DIAGNOSIS — Z961 Presence of intraocular lens: Secondary | ICD-10-CM | POA: Diagnosis not present

## 2023-08-15 DIAGNOSIS — H31401 Unspecified choroidal detachment, right eye: Secondary | ICD-10-CM | POA: Diagnosis not present

## 2024-05-29 HISTORY — PX: EYE SURGERY: SHX253

## 2024-06-01 DIAGNOSIS — H33001 Unspecified retinal detachment with retinal break, right eye: Secondary | ICD-10-CM | POA: Diagnosis not present

## 2024-06-01 DIAGNOSIS — H33002 Unspecified retinal detachment with retinal break, left eye: Secondary | ICD-10-CM | POA: Diagnosis not present

## 2024-06-02 DIAGNOSIS — H3322 Serous retinal detachment, left eye: Secondary | ICD-10-CM | POA: Diagnosis not present

## 2024-06-02 DIAGNOSIS — H33001 Unspecified retinal detachment with retinal break, right eye: Secondary | ICD-10-CM | POA: Diagnosis not present

## 2024-06-02 DIAGNOSIS — H33022 Retinal detachment with multiple breaks, left eye: Secondary | ICD-10-CM | POA: Diagnosis not present

## 2024-06-02 DIAGNOSIS — H35372 Puckering of macula, left eye: Secondary | ICD-10-CM | POA: Diagnosis not present

## 2024-06-02 DIAGNOSIS — Z961 Presence of intraocular lens: Secondary | ICD-10-CM | POA: Diagnosis not present

## 2024-06-02 DIAGNOSIS — H5462 Unqualified visual loss, left eye, normal vision right eye: Secondary | ICD-10-CM | POA: Diagnosis not present

## 2024-06-10 DIAGNOSIS — H33001 Unspecified retinal detachment with retinal break, right eye: Secondary | ICD-10-CM | POA: Diagnosis not present

## 2024-06-10 DIAGNOSIS — H338 Other retinal detachments: Secondary | ICD-10-CM | POA: Diagnosis not present

## 2024-06-10 DIAGNOSIS — Z961 Presence of intraocular lens: Secondary | ICD-10-CM | POA: Diagnosis not present

## 2024-06-10 DIAGNOSIS — H31401 Unspecified choroidal detachment, right eye: Secondary | ICD-10-CM | POA: Diagnosis not present

## 2024-06-10 DIAGNOSIS — H3322 Serous retinal detachment, left eye: Secondary | ICD-10-CM | POA: Diagnosis not present

## 2024-07-07 DIAGNOSIS — H33001 Unspecified retinal detachment with retinal break, right eye: Secondary | ICD-10-CM | POA: Diagnosis not present

## 2024-07-16 ENCOUNTER — Ambulatory Visit (INDEPENDENT_AMBULATORY_CARE_PROVIDER_SITE_OTHER)

## 2024-07-16 VITALS — Ht 72.0 in | Wt 190.0 lb

## 2024-07-16 DIAGNOSIS — Z0000 Encounter for general adult medical examination without abnormal findings: Secondary | ICD-10-CM

## 2024-07-16 NOTE — Patient Instructions (Signed)
 Mark Watkins,  Thank you for taking the time for your Medicare Wellness Visit. I appreciate your continued commitment to your health goals. Please review the care plan we discussed, and feel free to reach out if I can assist you further.  Medicare recommends these wellness visits once per year to help you and your care team stay ahead of potential health issues. These visits are designed to focus on prevention, allowing your provider to concentrate on managing your acute and chronic conditions during your regular appointments.  Please note that Annual Wellness Visits do not include a physical exam. Some assessments may be limited, especially if the visit was conducted virtually. If needed, we may recommend a separate in-person follow-up with your provider.  Ongoing Care Seeing your primary care provider every 3 to 6 months helps us  monitor your health and provide consistent, personalized care.   Referrals If a referral was made during today's visit and you haven't received any updates within two weeks, please contact the referred provider directly to check on the status.  Recommended Screenings:  Health Maintenance  Topic Date Due   DTaP/Tdap/Td vaccine (1 - Tdap) Never done   Zoster (Shingles) Vaccine (1 of 2) Never done   Flu Shot  05/29/2024   COVID-19 Vaccine (2 - 2025-26 season) 06/29/2024   Medicare Annual Wellness Visit  07/16/2025   Pneumococcal Vaccine for age over 72  Completed   HPV Vaccine  Aged Out   Meningitis B Vaccine  Aged Out       07/16/2024    9:41 AM  Advanced Directives  Does Patient Have a Medical Advance Directive? No  Would patient like information on creating a medical advance directive? Yes (MAU/Ambulatory/Procedural Areas - Information given)   Advance Care Planning is important because it: Ensures you receive medical care that aligns with your values, goals, and preferences. Provides guidance to your family and loved ones, reducing the emotional burden  of decision-making during critical moments.  Information on Advanced Care Planning can be found at [place]  Secretary of [facility] Advance Health Care Directives Advance Health Care Directives ([email or web address]   Vision: Annual vision screenings are recommended for early detection of glaucoma, cataracts, and diabetic retinopathy. These exams can also reveal signs of chronic conditions such as diabetes and high blood pressure.  Dental: Annual dental screenings help detect early signs of oral cancer, gum disease, and other conditions linked to overall health, including heart disease and diabetes.  Please see the attached documents for additional preventive care recommendations.

## 2024-07-16 NOTE — Progress Notes (Signed)
 Subjective:   Mark Watkins is a 89 y.o. who presents for a Medicare Wellness preventive visit.  As a reminder, Annual Wellness Visits don't include a physical exam, and some assessments may be limited, especially if this visit is performed virtually. We may recommend an in-person follow-up visit with your provider if needed.  Visit Complete: Virtual I connected with  Mark Watkins on 07/16/24 by a audio enabled telemedicine application and verified that I am speaking with the correct person using two identifiers.  Patient Location: Home  Provider Location: Home Office  I discussed the limitations of evaluation and management by telemedicine. The patient expressed understanding and agreed to proceed.  Vital Signs: Because this visit was a virtual/telehealth visit, some criteria may be missing or patient reported. Any vitals not documented were not able to be obtained and vitals that have been documented are patient reported.  VideoDeclined- This patient declined Librarian, academic. Therefore the visit was completed with audio only.  Persons Participating in Visit: Patient assisted by wife Ronnald.  AWV Questionnaire: No: Patient Medicare AWV questionnaire was not completed prior to this visit.  Cardiac Risk Factors include: advanced age (>68men, >32 women);male gender     Objective:    Today's Vitals   07/16/24 0935  Weight: 190 lb (86.2 kg)  Height: 6' (1.829 m)   Body mass index is 25.77 kg/m.     07/16/2024    9:41 AM 07/11/2023    2:42 PM 07/11/2022    9:55 AM 06/15/2021   10:32 AM  Advanced Directives  Does Patient Have a Medical Advance Directive? No Yes Yes Yes  Type of Special educational needs teacher of [place];Living will  Healthcare Power of [place];Living will  Does patient want to make changes to medical advance directive?   Yes (ED - Information included in AVS) No - Patient declined  Copy of Healthcare Power of Attorney  in Chart?  No - copy requested  No - copy requested  Would patient like information on creating a medical advance directive? Yes (MAU/Ambulatory/Procedural Areas - Information given)       Current Medications (verified) Outpatient Encounter Medications as of 07/16/2024  Medication Sig   Difluprednate 0.05 % EMUL Apply 1 drop to eye.   Multiple Vitamin (MULTIVITAMIN ADULT PO) Take 1 tablet by mouth daily.   No facility-administered encounter medications on file as of 07/16/2024.    Allergies (verified) Lipitor [atorvastatin]   History: Past Medical History:  Diagnosis Date   Acute respiratory failure with hypoxia (HCC)    Age-related cognitive decline    Benign neoplasm of cerebral meninges (HCC)    Elevated BP without diagnosis of hypertension    Multiple subsegmental pulmonary emboli without acute cor pulmonale (HCC)    Parageusia    Solitary pulmonary nodule    Past Surgical History:  Procedure Laterality Date   CATARACT EXTRACTION, BILATERAL     EYE SURGERY  05/2024   REPLACEMENT TOTAL KNEE BILATERAL     Family History  Problem Relation Age of Onset   Alzheimer's disease Mother    Osteoarthritis Mother    Heart murmur Mother    Heart attack Father    Social History   Socioeconomic History   Marital status: Married    Spouse name: Ronnald   Number of children: 2   Years of education: Not on file   Highest education level: Not on file  Occupational History   Not on file  Tobacco Use  Smoking status: Former    Current packs/day: 0.00    Types: Cigarettes    Quit date: 80    Years since quitting: 58.7   Smokeless tobacco: Never  Vaping Use   Vaping status: Never Used  Substance and Sexual Activity   Alcohol use: Never   Drug use: Never   Sexual activity: Not on file  Other Topics Concern   Not on file  Social History Narrative   Works part time Monday, Tuesday, 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[employer]; 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  (August 2025)    Social Drivers of Health   Financial Resource Strain: Low Risk  (07/16/2024)   Overall Financial Resource Strain (CARDIA)    Difficulty of Paying Living Expenses: Not hard at all  Food Insecurity: No Food Insecurity (07/16/2024)   Hunger Vital Sign    Worried About Running Out of Food in the Last Year: Never true    Ran Out of Food in the Last Year: Never true  Transportation Needs: No Transportation Needs (07/16/2024)   PRAPARE - Administrator, Civil Service (Medical): No    Lack of Transportation (Non-Medical): No  Physical Activity: Inactive (07/16/2024)   Exercise Vital Sign    Days of Exercise per Week: 0 days    Minutes of Exercise per Session: 0 min  Stress: No Stress Concern Present (07/16/2024)   [employer] of Occupational Health - Occupational Stress Questionnaire    Feeling of Stress: Not at all  Social Connections: Socially Integrated (07/16/2024)   Social Connection and Isolation Panel    Frequency of Communication with Friends and Family: More than three times a week    Frequency of Social Gatherings with Friends and Family: More than three times a week    Attends Religious Services: More than 4 times per year    Active Member of [employer] or Organizations: Yes    Attends Engineer, structural: More than 4 times per year    Marital Status: Married    Tobacco Counseling Counseling given: Not Answered    Clinical Intake:  Pre-visit preparation completed: Yes  Pain : No/denies pain  Diabetes: No  Lab Results  Component Value Date   HGBA1C 5.9 (H) 07/11/2022   HGBA1C 6.0 (H) 01/11/2022   HGBA1C 5.6 03/17/2020     How often do you need to have someone help you when you read instructions, pamphlets, or other written materials from your doctor or pharmacy?: 1 - Never  Interpreter Needed?: No  Information entered by :: Charmaine Bloodgood LPN   Activities of Daily Living     07/16/2024    9:35 AM  In your present state  of health, do you have any difficulty performing the following activities:  Hearing? 0  Vision? 1  Difficulty concentrating or making decisions? 1  Walking or climbing stairs? 0  Dressing or bathing? 0  Doing errands, shopping? 0  Preparing Food and eating ? N  Using the Toilet? N  In the past six months, have you accidently leaked urine? N  Do you have problems with loss of bowel control? N  Managing your Medications? N  Managing your Finances? N  Housekeeping or managing your Housekeeping? N    Patient Care Team: Sherre Clapper, MD as PCP - General (Family Medicine) Erasmo Bernardino BRAVO, OD (Optometry) Festus Ginnie Rei, MD as Referring Physician (Ophthalmology)  I have updated your Care Teams any recent Medical Services you may have received from other providers  in the past year.     Assessment:   This is a routine wellness examination for Jahsiah.  Hearing/Vision screen Hearing Screening - Comments:: Denies hearing difficulties   Vision Screening - Comments:: Wears rx glasses - up to date with routine eye exams with Dr. Ginnie Closs    Goals Addressed             This Visit's Progress    Remain active and independent   On track      Depression Screen     07/16/2024    9:40 AM 07/11/2023    2:40 PM 07/11/2022    9:07 AM 01/11/2022    8:04 AM 07/12/2021   10:49 AM 06/15/2021   10:30 AM 03/17/2020   11:02 AM  PHQ 2/9 Scores  PHQ - 2 Score 0 0 0 0 0 0 0    Fall Risk     07/16/2024    9:42 AM 07/11/2023    2:41 PM 07/11/2022    9:07 AM 01/11/2022    8:04 AM 07/12/2021   10:49 AM  Fall Risk   Falls in the past year? 0 0 0 0 0  Number falls in past yr: 0 0 0 0 0  Injury with Fall? 0 0 0 0 0  Risk for fall due to : Impaired vision No Fall Risks No Fall Risks    Follow up Falls prevention discussed;Education provided;Falls evaluation completed Falls prevention discussed Falls evaluation completed  Falls evaluation completed  Falls evaluation completed      Data saved with  a previous flowsheet row definition    MEDICARE RISK AT HOME:  Medicare Risk at Home Any stairs in or around the home?: No If so, are there any without handrails?: No Home free of loose throw rugs in walkways, pet beds, electrical cords, etc?: Yes Adequate lighting in your home to reduce risk of falls?: Yes Life alert?: No Use of a cane, walker or w/c?: No Grab bars in the bathroom?: Yes Shower chair or bench in shower?: No Elevated toilet seat or a handicapped toilet?: Yes  TIMED UP AND GO:  Was the test performed?  No  Cognitive Function: 6CIT completed        07/16/2024    9:42 AM 07/11/2023    2:42 PM 06/15/2021   10:41 AM  6CIT Screen  What Year? 0 points 0 points 0 points  What month? 0 points 0 points 0 points  What time? 0 points 0 points 0 points  Count back from 20 0 points 0 points 0 points  Months in reverse 2 points 0 points 0 points  Repeat phrase 4 points 6 points 0 points  Total Score 6 points 6 points 0 points    Immunizations Immunization History  Administered Date(s) Administered   Fluad Quad(high Dose 65+) 07/27/2020, 07/12/2021, 07/11/2022   Janssen (J&J) SARS-COV-2 Vaccination 01/25/2020   Pneumococcal Conjugate-13 11/13/2017   Pneumococcal Polysaccharide-23 11/20/2018    Screening Tests Health Maintenance  Topic Date Due   DTaP/Tdap/Td (1 - Tdap) Never done   Zoster Vaccines- Shingrix (1 of 2) Never done   Influenza Vaccine  05/29/2024   COVID-19 Vaccine (2 - 2025-26 season) 06/29/2024   Medicare Annual Wellness (AWV)  07/16/2025   Pneumococcal Vaccine: 50+ Years  Completed   HPV VACCINES  Aged Out   Meningococcal B Vaccine  Aged Out    Health Maintenance Items Addressed: Information provided on vaccine recommendations   Additional Screening:  Vision Screening: Recommended  annual ophthalmology exams for early detection of glaucoma and other disorders of the eye. Is the patient up to date with their annual eye exam?  Yes  Who is the  provider or what is the name of the office in which the patient attends annual eye exams? Dr. Festus   Dental Screening: Recommended annual dental exams for proper oral hygiene  Community Resource Referral / Chronic Care Management: CRR required this visit?  No   CCM required this visit?  No   Plan:    I have personally reviewed and noted the following in the patient's chart:   Medical and social history Use of alcohol, tobacco or illicit drugs  Current medications and supplements including opioid prescriptions. Patient is not currently taking opioid prescriptions. Functional ability and status Nutritional status Physical activity Advanced directives List of other physicians Hospitalizations, surgeries, and ER visits in previous 12 months Vitals Screenings to include cognitive, depression, and falls Referrals and appointments  In addition, I have reviewed and discussed with patient certain preventive protocols, quality metrics, and best practice recommendations. A written personalized care plan for preventive services as well as general preventive health recommendations were provided to patient.   Lavelle Pfeiffer [place], [place]   [id number]   After Visit Summary: (MyChart) Due to this being a telephonic visit, the after visit summary with patients personalized plan was offered to patient via MyChart   Notes: Nothing significant to report at this time.

## 2024-08-26 ENCOUNTER — Telehealth: Payer: Self-pay

## 2024-08-26 NOTE — Telephone Encounter (Signed)
 Patient overdue for follow up. Spoke with his wife and he is scheduled for 09/2024.

## 2024-10-28 ENCOUNTER — Encounter: Payer: Self-pay | Admitting: Family Medicine

## 2024-10-28 ENCOUNTER — Ambulatory Visit (INDEPENDENT_AMBULATORY_CARE_PROVIDER_SITE_OTHER): Admitting: Family Medicine

## 2024-10-28 VITALS — BP 144/68 | HR 93 | Temp 98.0°F | Resp 18 | Ht 72.0 in | Wt 188.2 lb

## 2024-10-28 DIAGNOSIS — M791 Myalgia, unspecified site: Secondary | ICD-10-CM

## 2024-10-28 DIAGNOSIS — R413 Other amnesia: Secondary | ICD-10-CM

## 2024-10-28 DIAGNOSIS — E782 Mixed hyperlipidemia: Secondary | ICD-10-CM

## 2024-10-28 DIAGNOSIS — R030 Elevated blood-pressure reading, without diagnosis of hypertension: Secondary | ICD-10-CM | POA: Diagnosis not present

## 2024-10-28 DIAGNOSIS — R7303 Prediabetes: Secondary | ICD-10-CM | POA: Diagnosis not present

## 2024-10-28 DIAGNOSIS — D329 Benign neoplasm of meninges, unspecified: Secondary | ICD-10-CM

## 2024-10-28 DIAGNOSIS — T466X5A Adverse effect of antihyperlipidemic and antiarteriosclerotic drugs, initial encounter: Secondary | ICD-10-CM

## 2024-10-28 LAB — POCT GLYCOSYLATED HEMOGLOBIN (HGB A1C): HbA1c POC (<> result, manual entry): 5.5 %

## 2024-10-28 LAB — POCT LIPID PANEL
HDL: 72
LDL: 131
Non-HDL: 155
TC: 227
TRG: 120

## 2024-10-28 NOTE — Assessment & Plan Note (Addendum)
 Recommend continue to work on eating healthy diet and exercise. Refuses all cholesterol medicines. Intolerant to statins Orders:   POCT Lipid Panel   CBC with Differential/Platelet   Comprehensive metabolic panel with GFR   TSH

## 2024-10-28 NOTE — Assessment & Plan Note (Signed)
 Recheck memory test at next visit.

## 2024-10-28 NOTE — Assessment & Plan Note (Signed)
 I do not recommended medicines for treatment of mildly elevated bp due to his age of 89 yo.

## 2024-10-28 NOTE — Assessment & Plan Note (Signed)
 MRI brain annually recommended, but patient refuses.  Denies any symptoms.

## 2024-10-28 NOTE — Assessment & Plan Note (Addendum)
 Recommend continue to work on eating healthy diet and exercise.  Orders:   POCT glycosylated hemoglobin (Hb A1C)   CBC with Differential/Platelet   Comprehensive metabolic panel with GFR   TSH

## 2024-10-28 NOTE — Progress Notes (Signed)
 "  Subjective:  Patient ID: Mark Watkins, male    DOB: December 26, 1930  Age: 89 y.o. MRN: [id number]  Chief Complaint  Patient presents with   Medical Management of Chronic Issues    HPI: Discussed the use of AI scribe software for clinical note transcription with the patient, who gave verbal consent to proceed.  History of Present Illness Mark Watkins is a 89 year old male who presents for a routine follow-up visit.  General symptoms - No fever, chills, sweats, earache, sore throat, stuffy nose, chest pain, abdominal pain, breathing problems, bowel problems, bladder problems, joint pain, muscle pain, back pain, headaches, or dizziness.  Cognitive function - Memory troubles present but not worsening per patient. His wife told me later that his memory is worsening.  - Recalls seeing a specialist for a brain-related issue but does not remember details of any required follow-up.  Visual impairment - Unable to drive recently due to an eye operation. - Awaiting clearance from his doctor to resume driving.  Hyperlipidemia - Total cholesterol level 227 mg/dL. - LDL cholesterol 131 mg/dL. - Previously tried Lipitor but did not tolerate it well. - Not currently on any cholesterol-lowering medication. - Reluctant to take cholesterol-lowering drugs.  Glycemic control - A1c level 5.6%, improved from 5.9% in September.  Preventive care - Has not received a flu shot this year. - Dislikes vaccines.  Hypertension - Does not check blood pressure at home.       07/16/2024    9:40 AM 07/11/2023    2:40 PM 07/11/2022    9:07 AM 01/11/2022    8:04 AM 07/12/2021   10:49 AM  Depression screen PHQ 2/9  Decreased Interest 0 0 0 0 0  Down, Depressed, Hopeless 0 0 0 0 0  PHQ - 2 Score 0 0 0 0 0        07/16/2024    9:42 AM  Fall Risk   Falls in the past year? 0  Number falls in past yr: 0  Injury with Fall? 0   Risk for fall due to : Impaired vision  Follow up Falls prevention  discussed;Education provided;Falls evaluation completed     Data saved with a previous flowsheet row definition    Patient Care Team: Sherre Clapper, MD as PCP - General (Family Medicine) Erasmo Bernardino BRAVO, OD (Optometry) Festus Ginnie Rei, MD as Referring Physician (Ophthalmology)   Review of Systems  Constitutional:  Negative for chills, fatigue and fever.  HENT:  Negative for congestion, ear pain and sore throat.   Respiratory:  Negative for cough and shortness of breath.   Cardiovascular:  Negative for chest pain.  Gastrointestinal:  Negative for abdominal pain, constipation, diarrhea, nausea and vomiting.  Genitourinary:  Negative for dysuria and frequency.  Musculoskeletal:  Negative for arthralgias and myalgias.  Neurological:  Negative for dizziness and headaches.  Psychiatric/Behavioral:  Negative for dysphoric mood.        No dysphoria    Medications Ordered Prior to Encounter[1] Past Medical History:  Diagnosis Date   Acute respiratory failure with hypoxia (HCC)    Age-related cognitive decline    Benign neoplasm of cerebral meninges (HCC)    Elevated BP without diagnosis of hypertension    Multiple subsegmental pulmonary emboli without acute cor pulmonale (HCC)    Parageusia    Solitary pulmonary nodule    Past Surgical History:  Procedure Laterality Date   CATARACT EXTRACTION, BILATERAL     EYE SURGERY  05/2024  REPLACEMENT TOTAL KNEE BILATERAL      Family History  Problem Relation Age of Onset   Alzheimer's disease Mother    Osteoarthritis Mother    Heart murmur Mother    Heart attack Father    Social History   Socioeconomic History   Marital status: Married    Spouse name: Mark Watkins   Number of children: 2   Years of education: Not on file   Highest education level: 12th grade  Occupational History   Not on file  Tobacco Use   Smoking status: Former    Current packs/day: 0.00    Types: Cigarettes    Quit date: 1967    Years since quitting: 59.0    Smokeless tobacco: Never  Vaping Use   Vaping status: Never Used  Substance and Sexual Activity   Alcohol use: Never   Drug use: Never   Sexual activity: Not on file  Other Topics Concern   Not on file  Social History Narrative   Works part time Monday, Tuesday, 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[employer]; 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 (August 2025)    Social Drivers of Health   Tobacco Use: Medium Risk (10/28/2024)   Patient History    Smoking Tobacco Use: Former    Smokeless Tobacco Use: Never    Passive Exposure: Not on Actuary Strain: Low Risk (10/27/2024)   Overall Financial Resource Strain (CARDIA)    Difficulty of Paying Living Expenses: Not hard at all  Food Insecurity: No Food Insecurity (10/27/2024)   Epic    Worried About Programme Researcher, Broadcasting/film/video in the Last Year: Never true    Ran Out of Food in the Last Year: Never true  Transportation Needs: No Transportation Needs (10/27/2024)   Epic    Lack of Transportation (Medical): No    Lack of Transportation (Non-Medical): No  Physical Activity: Inactive (10/27/2024)   Exercise Vital Sign    Days of Exercise per Week: 0 days    Minutes of Exercise per Session: Not on file  Stress: No Stress Concern Present (07/16/2024)   [employer] of Occupational Health - Occupational Stress Questionnaire    Feeling of Stress: Not at all  Social Connections: Unknown (10/27/2024)   Social Connection and Isolation Panel    Frequency of Communication with Friends and Family: Twice a week    Frequency of Social Gatherings with Friends and Family: Not on file    Attends Religious Services: Not on file    Active Member of Clubs or Organizations: No    Attends Banker Meetings: Not on file    Marital Status: Married  Depression (PHQ2-9): Low Risk (07/16/2024)   Depression (PHQ2-9)    PHQ-2 Score: 0  Alcohol Screen: Low Risk (07/16/2024)   Alcohol Screen    Last Alcohol Screening Score  (AUDIT): 0  Housing: Unknown (10/27/2024)   Epic    Unable to Pay for Housing in the Last Year: No    Number of Times Moved in the Last Year: Not on file    Homeless in the Last Year: No  Utilities: Not At Risk (07/16/2024)   Epic    Threatened with loss of utilities: No  Health Literacy: Adequate Health Literacy (07/16/2024)   B1300 Health Literacy    Frequency of need for help with medical instructions: Never    Objective:  BP (!) 144/68   Pulse 93   Temp 98 F (36.7 C) (Temporal)   Resp 18  Ht 6' (1.829 m)   Wt 188 lb 3.2 oz (85.4 kg)   SpO2 97%   BMI 25.52 kg/m      10/28/2024   11:00 AM 10/28/2024    9:58 AM 07/16/2024    9:35 AM  BP/Weight  Systolic BP 144 162 --  Diastolic BP 68 68 --  Wt. (Lbs)  188.2 190  BMI  25.52 kg/m2 25.77 kg/m2    Physical Exam Vitals reviewed.  Constitutional:      Appearance: Normal appearance.  HENT:     Right Ear: Tympanic membrane, ear canal and external ear normal.     Left Ear: Tympanic membrane, ear canal and external ear normal.     Nose: Nose normal. No congestion or rhinorrhea.     Mouth/Throat:     Mouth: Mucous membranes are moist.     Pharynx: No oropharyngeal exudate or posterior oropharyngeal erythema.  Neck:     Vascular: No carotid bruit.  Cardiovascular:     Rate and Rhythm: Normal rate and regular rhythm.     Pulses: Normal pulses.     Heart sounds: Normal heart sounds.  Pulmonary:     Effort: Pulmonary effort is normal. No respiratory distress.     Breath sounds: Normal breath sounds. No wheezing, rhonchi or rales.  Abdominal:     General: Bowel sounds are normal.     Palpations: Abdomen is soft.     Tenderness: There is no abdominal tenderness.  Lymphadenopathy:     Cervical: No cervical adenopathy.  Neurological:     Mental Status: He is alert and oriented to person, place, and time.  Psychiatric:        Mood and Affect: Mood normal.        Behavior: Behavior normal.         Lab Results   Component Value Date   WBC 7.1 07/11/2022   HGB 14.4 07/11/2022   HCT 42.8 07/11/2022   PLT 239 07/11/2022   GLUCOSE 99 07/11/2022   CHOL 196 07/11/2022   TRIG 95 07/11/2022   HDL 57 07/11/2022   LDLCALC 122 (H) 07/11/2022   ALT 13 07/11/2022   AST 16 07/11/2022   NA 143 07/11/2022   K 4.4 07/11/2022   CL 106 07/11/2022   CREATININE 1.02 07/11/2022   BUN 13 07/11/2022   CO2 25 07/11/2022   TSH 3.470 01/11/2022   HGBA1C 5.5 10/28/2024    Results for orders placed or performed in visit on 10/28/24  POCT glycosylated hemoglobin (Hb A1C)   Collection Time: 10/28/24 10:15 AM  Result Value Ref Range   Hemoglobin A1C     HbA1c POC (<> result, manual entry) 5.5 4.0 - 5.6 %   HbA1c, POC (prediabetic range)     HbA1c, POC (controlled diabetic range)    POCT Lipid Panel   Collection Time: 10/28/24 10:17 AM  Result Value Ref Range   TC 227    HDL 72    TRG 120    LDL 131    Non-HDL 155    TC/HDL    .  Assessment & Plan:   Assessment & Plan Mixed hyperlipidemia Recommend continue to work on eating healthy diet and exercise. Refuses all cholesterol medicines. Intolerant to statins Orders:   POCT Lipid Panel   CBC with Differential/Platelet   Comprehensive metabolic panel with GFR   TSH   Prediabetes Recommend continue to work on eating healthy diet and exercise.  Orders:   POCT glycosylated hemoglobin (Hb  A1C)   CBC with Differential/Platelet   Comprehensive metabolic panel with GFR   TSH   Meningioma (HCC) MRI brain annually recommended, but patient refuses.  Denies any symptoms.     Myalgia due to statin Intolerant to statins.     Memory loss Recheck memory test at next visit.     Elevated BP reading w/ no diagnosis of HTN I do not recommended medicines for treatment of mildly elevated bp due to his age of 89 yo.       Body mass index is 25.52 kg/m.    No orders of the defined types were placed in this encounter.   Orders Placed This  Encounter  Procedures   CBC with Differential/Platelet   Comprehensive metabolic panel with GFR   TSH   POCT glycosylated hemoglobin (Hb A1C)   POCT Lipid Panel       Follow-up: Return in about 6 months (around 04/27/2025) for chronic follow up.  An After Visit Summary was printed and given to the patient.  Abigail Free, MD [facility] [phone]     [1]  Current Outpatient Medications on File Prior to Visit  Medication Sig Dispense Refill   Difluprednate 0.05 % EMUL Apply 1 drop to eye.     Multiple Vitamin (MULTIVITAMIN ADULT PO) Take 1 tablet by mouth daily.     No current facility-administered medications on file prior to visit.   "

## 2024-10-28 NOTE — Assessment & Plan Note (Signed)
 Intolerant to statins.

## 2024-10-29 ENCOUNTER — Ambulatory Visit: Payer: Self-pay | Admitting: Family Medicine

## 2024-10-29 LAB — CBC WITH DIFFERENTIAL/PLATELET
Basophils Absolute: 0.1 x10E3/uL (ref 0.0–0.2)
Basos: 1 %
EOS (ABSOLUTE): 0.4 x10E3/uL (ref 0.0–0.4)
Eos: 4 %
Hematocrit: 46.5 % (ref 37.5–51.0)
Hemoglobin: 15.5 g/dL (ref 13.0–17.7)
Immature Grans (Abs): 0 x10E3/uL (ref 0.0–0.1)
Immature Granulocytes: 0 %
Lymphocytes Absolute: 2.2 x10E3/uL (ref 0.7–3.1)
Lymphs: 25 %
MCH: 29.7 pg (ref 26.6–33.0)
MCHC: 33.3 g/dL (ref 31.5–35.7)
MCV: 89 fL (ref 79–97)
Monocytes Absolute: 0.9 x10E3/uL (ref 0.1–0.9)
Monocytes: 10 %
Neutrophils Absolute: 5.3 x10E3/uL (ref 1.4–7.0)
Neutrophils: 60 %
Platelets: 291 x10E3/uL (ref 150–450)
RBC: 5.22 x10E6/uL (ref 4.14–5.80)
RDW: 13.2 % (ref 11.6–15.4)
WBC: 8.8 x10E3/uL (ref 3.4–10.8)

## 2024-10-29 LAB — COMPREHENSIVE METABOLIC PANEL WITH GFR
ALT: 16 IU/L (ref 0–44)
AST: 16 IU/L (ref 0–40)
Albumin: 4.3 g/dL (ref 3.6–4.6)
Alkaline Phosphatase: 112 IU/L (ref 48–129)
BUN/Creatinine Ratio: 16 (ref 10–24)
BUN: 16 mg/dL (ref 10–36)
Bilirubin Total: 0.3 mg/dL (ref 0.0–1.2)
CO2: 27 mmol/L (ref 20–29)
Calcium: 9.6 mg/dL (ref 8.6–10.2)
Chloride: 101 mmol/L (ref 96–106)
Creatinine, Ser: 0.98 mg/dL (ref 0.76–1.27)
Globulin, Total: 2.5 g/dL (ref 1.5–4.5)
Glucose: 94 mg/dL (ref 70–99)
Potassium: 4.4 mmol/L (ref 3.5–5.2)
Sodium: 142 mmol/L (ref 134–144)
Total Protein: 6.8 g/dL (ref 6.0–8.5)
eGFR: 73 mL/min/1.73

## 2024-10-29 LAB — TSH: TSH: 4.7 u[IU]/mL — ABNORMAL HIGH (ref 0.450–4.500)

## 2024-10-31 LAB — T4, FREE: Free T4: 1.15 ng/dL (ref 0.82–1.77)

## 2024-10-31 LAB — SPECIMEN STATUS REPORT

## 2024-12-08 ENCOUNTER — Ambulatory Visit: Admitting: Family Medicine

## 2025-04-27 ENCOUNTER — Ambulatory Visit: Admitting: Family Medicine

## 2025-07-22 ENCOUNTER — Ambulatory Visit
